# Patient Record
Sex: Male | Born: 1945 | Race: Black or African American | Hispanic: No | Smoking: Current every day smoker
Health system: Southern US, Community
[De-identification: ages and names within clinical notes are randomized; demographics above are authoritative.]

## PROBLEM LIST (undated history)

## (undated) HISTORY — PX: HERNIA REPAIR: SHX51

## (undated) HISTORY — PX: TONSILLECTOMY: SUR1361

## (undated) HISTORY — PX: CATARACT EXTRACTION: SUR2

## (undated) HISTORY — PX: REPLACEMENT TOTAL KNEE: SUR1224

---

## 2014-12-20 DIAGNOSIS — I10 Essential (primary) hypertension: Secondary | ICD-10-CM | POA: Insufficient documentation

## 2014-12-20 HISTORY — DX: Essential (primary) hypertension: I10

## 2015-10-27 DIAGNOSIS — Z79899 Other long term (current) drug therapy: Secondary | ICD-10-CM | POA: Diagnosis not present

## 2015-10-27 DIAGNOSIS — G8929 Other chronic pain: Secondary | ICD-10-CM | POA: Diagnosis not present

## 2015-10-27 DIAGNOSIS — I10 Essential (primary) hypertension: Secondary | ICD-10-CM | POA: Diagnosis not present

## 2015-10-27 DIAGNOSIS — M5442 Lumbago with sciatica, left side: Secondary | ICD-10-CM | POA: Diagnosis not present

## 2015-10-27 DIAGNOSIS — J439 Emphysema, unspecified: Secondary | ICD-10-CM | POA: Diagnosis not present

## 2015-10-27 DIAGNOSIS — R002 Palpitations: Secondary | ICD-10-CM | POA: Diagnosis not present

## 2015-10-30 DIAGNOSIS — M47896 Other spondylosis, lumbar region: Secondary | ICD-10-CM | POA: Diagnosis not present

## 2015-10-30 DIAGNOSIS — M5136 Other intervertebral disc degeneration, lumbar region: Secondary | ICD-10-CM | POA: Diagnosis not present

## 2015-10-30 DIAGNOSIS — J439 Emphysema, unspecified: Secondary | ICD-10-CM | POA: Diagnosis not present

## 2015-10-30 DIAGNOSIS — M5442 Lumbago with sciatica, left side: Secondary | ICD-10-CM | POA: Diagnosis not present

## 2016-04-05 DIAGNOSIS — M21611 Bunion of right foot: Secondary | ICD-10-CM | POA: Diagnosis not present

## 2016-04-05 DIAGNOSIS — I10 Essential (primary) hypertension: Secondary | ICD-10-CM | POA: Diagnosis not present

## 2016-04-05 DIAGNOSIS — M5136 Other intervertebral disc degeneration, lumbar region: Secondary | ICD-10-CM | POA: Diagnosis not present

## 2016-05-13 DIAGNOSIS — B353 Tinea pedis: Secondary | ICD-10-CM | POA: Insufficient documentation

## 2016-05-13 DIAGNOSIS — M2041 Other hammer toe(s) (acquired), right foot: Secondary | ICD-10-CM | POA: Insufficient documentation

## 2016-05-13 DIAGNOSIS — L84 Corns and callosities: Secondary | ICD-10-CM | POA: Insufficient documentation

## 2016-05-13 DIAGNOSIS — M2042 Other hammer toe(s) (acquired), left foot: Secondary | ICD-10-CM | POA: Insufficient documentation

## 2016-05-13 HISTORY — DX: Tinea pedis: B35.3

## 2016-05-13 HISTORY — DX: Corns and callosities: L84

## 2016-05-13 HISTORY — DX: Other hammer toe(s) (acquired), right foot: M20.41

## 2016-09-17 DIAGNOSIS — M545 Low back pain: Secondary | ICD-10-CM | POA: Diagnosis not present

## 2016-09-17 DIAGNOSIS — G8929 Other chronic pain: Secondary | ICD-10-CM | POA: Diagnosis not present

## 2016-09-17 DIAGNOSIS — N400 Enlarged prostate without lower urinary tract symptoms: Secondary | ICD-10-CM | POA: Diagnosis not present

## 2016-09-17 DIAGNOSIS — J449 Chronic obstructive pulmonary disease, unspecified: Secondary | ICD-10-CM | POA: Diagnosis not present

## 2016-09-17 DIAGNOSIS — Z0000 Encounter for general adult medical examination without abnormal findings: Secondary | ICD-10-CM | POA: Diagnosis not present

## 2016-09-17 DIAGNOSIS — Z1389 Encounter for screening for other disorder: Secondary | ICD-10-CM | POA: Diagnosis not present

## 2016-09-17 DIAGNOSIS — Z1212 Encounter for screening for malignant neoplasm of rectum: Secondary | ICD-10-CM | POA: Diagnosis not present

## 2016-09-17 DIAGNOSIS — I10 Essential (primary) hypertension: Secondary | ICD-10-CM | POA: Diagnosis not present

## 2016-10-03 DIAGNOSIS — Z01818 Encounter for other preprocedural examination: Secondary | ICD-10-CM | POA: Diagnosis not present

## 2016-10-15 DIAGNOSIS — Z1211 Encounter for screening for malignant neoplasm of colon: Secondary | ICD-10-CM | POA: Diagnosis not present

## 2016-10-15 DIAGNOSIS — Z79899 Other long term (current) drug therapy: Secondary | ICD-10-CM | POA: Diagnosis not present

## 2016-10-15 DIAGNOSIS — J449 Chronic obstructive pulmonary disease, unspecified: Secondary | ICD-10-CM | POA: Diagnosis not present

## 2016-10-15 DIAGNOSIS — K648 Other hemorrhoids: Secondary | ICD-10-CM | POA: Diagnosis not present

## 2016-10-15 DIAGNOSIS — R011 Cardiac murmur, unspecified: Secondary | ICD-10-CM | POA: Diagnosis not present

## 2016-10-15 DIAGNOSIS — K5730 Diverticulosis of large intestine without perforation or abscess without bleeding: Secondary | ICD-10-CM | POA: Diagnosis not present

## 2016-10-15 DIAGNOSIS — I10 Essential (primary) hypertension: Secondary | ICD-10-CM | POA: Diagnosis not present

## 2016-10-15 DIAGNOSIS — K469 Unspecified abdominal hernia without obstruction or gangrene: Secondary | ICD-10-CM | POA: Diagnosis not present

## 2016-10-15 DIAGNOSIS — Z7982 Long term (current) use of aspirin: Secondary | ICD-10-CM | POA: Diagnosis not present

## 2016-11-20 DIAGNOSIS — H26493 Other secondary cataract, bilateral: Secondary | ICD-10-CM | POA: Diagnosis not present

## 2017-01-13 DIAGNOSIS — H8110 Benign paroxysmal vertigo, unspecified ear: Secondary | ICD-10-CM | POA: Diagnosis not present

## 2017-01-15 DIAGNOSIS — H8110 Benign paroxysmal vertigo, unspecified ear: Secondary | ICD-10-CM | POA: Diagnosis not present

## 2017-01-22 DIAGNOSIS — H8110 Benign paroxysmal vertigo, unspecified ear: Secondary | ICD-10-CM | POA: Diagnosis not present

## 2017-05-19 DIAGNOSIS — D179 Benign lipomatous neoplasm, unspecified: Secondary | ICD-10-CM | POA: Diagnosis not present

## 2017-09-18 DIAGNOSIS — J449 Chronic obstructive pulmonary disease, unspecified: Secondary | ICD-10-CM | POA: Diagnosis not present

## 2017-09-18 DIAGNOSIS — I10 Essential (primary) hypertension: Secondary | ICD-10-CM | POA: Diagnosis not present

## 2017-09-18 DIAGNOSIS — Z1389 Encounter for screening for other disorder: Secondary | ICD-10-CM | POA: Diagnosis not present

## 2017-09-18 DIAGNOSIS — M5136 Other intervertebral disc degeneration, lumbar region: Secondary | ICD-10-CM | POA: Diagnosis not present

## 2017-09-18 DIAGNOSIS — Z0000 Encounter for general adult medical examination without abnormal findings: Secondary | ICD-10-CM | POA: Diagnosis not present

## 2017-09-18 DIAGNOSIS — K219 Gastro-esophageal reflux disease without esophagitis: Secondary | ICD-10-CM | POA: Diagnosis not present

## 2017-09-18 DIAGNOSIS — Z125 Encounter for screening for malignant neoplasm of prostate: Secondary | ICD-10-CM | POA: Diagnosis not present

## 2017-10-29 DIAGNOSIS — M5440 Lumbago with sciatica, unspecified side: Secondary | ICD-10-CM | POA: Diagnosis not present

## 2017-10-29 DIAGNOSIS — G894 Chronic pain syndrome: Secondary | ICD-10-CM | POA: Diagnosis not present

## 2017-10-29 DIAGNOSIS — Z79899 Other long term (current) drug therapy: Secondary | ICD-10-CM | POA: Diagnosis not present

## 2017-10-29 DIAGNOSIS — Z9114 Patient's other noncompliance with medication regimen: Secondary | ICD-10-CM | POA: Diagnosis not present

## 2017-10-29 DIAGNOSIS — G8921 Chronic pain due to trauma: Secondary | ICD-10-CM | POA: Diagnosis not present

## 2017-11-04 DIAGNOSIS — M545 Low back pain: Secondary | ICD-10-CM | POA: Diagnosis not present

## 2017-11-04 DIAGNOSIS — M25552 Pain in left hip: Secondary | ICD-10-CM | POA: Diagnosis not present

## 2017-11-04 DIAGNOSIS — M48061 Spinal stenosis, lumbar region without neurogenic claudication: Secondary | ICD-10-CM | POA: Diagnosis not present

## 2017-11-04 DIAGNOSIS — M5126 Other intervertebral disc displacement, lumbar region: Secondary | ICD-10-CM | POA: Diagnosis not present

## 2017-11-04 DIAGNOSIS — M25551 Pain in right hip: Secondary | ICD-10-CM | POA: Diagnosis not present

## 2017-11-19 DIAGNOSIS — G8921 Chronic pain due to trauma: Secondary | ICD-10-CM | POA: Diagnosis not present

## 2017-11-19 DIAGNOSIS — Z79899 Other long term (current) drug therapy: Secondary | ICD-10-CM | POA: Diagnosis not present

## 2017-11-19 DIAGNOSIS — Z9114 Patient's other noncompliance with medication regimen: Secondary | ICD-10-CM | POA: Diagnosis not present

## 2017-11-19 DIAGNOSIS — G894 Chronic pain syndrome: Secondary | ICD-10-CM | POA: Diagnosis not present

## 2017-11-19 DIAGNOSIS — M5440 Lumbago with sciatica, unspecified side: Secondary | ICD-10-CM | POA: Diagnosis not present

## 2017-12-17 DIAGNOSIS — Z79899 Other long term (current) drug therapy: Secondary | ICD-10-CM | POA: Diagnosis not present

## 2017-12-17 DIAGNOSIS — M5440 Lumbago with sciatica, unspecified side: Secondary | ICD-10-CM | POA: Diagnosis not present

## 2017-12-17 DIAGNOSIS — G894 Chronic pain syndrome: Secondary | ICD-10-CM | POA: Diagnosis not present

## 2017-12-17 DIAGNOSIS — Z9114 Patient's other noncompliance with medication regimen: Secondary | ICD-10-CM | POA: Diagnosis not present

## 2017-12-17 DIAGNOSIS — G8921 Chronic pain due to trauma: Secondary | ICD-10-CM | POA: Diagnosis not present

## 2018-01-19 DIAGNOSIS — I10 Essential (primary) hypertension: Secondary | ICD-10-CM | POA: Diagnosis not present

## 2018-01-19 DIAGNOSIS — M545 Low back pain: Secondary | ICD-10-CM | POA: Diagnosis not present

## 2018-04-20 DIAGNOSIS — M545 Low back pain: Secondary | ICD-10-CM | POA: Diagnosis not present

## 2018-04-25 DIAGNOSIS — B029 Zoster without complications: Secondary | ICD-10-CM | POA: Diagnosis not present

## 2018-04-25 DIAGNOSIS — B0223 Postherpetic polyneuropathy: Secondary | ICD-10-CM | POA: Diagnosis not present

## 2018-05-27 DIAGNOSIS — I10 Essential (primary) hypertension: Secondary | ICD-10-CM | POA: Diagnosis not present

## 2018-09-16 DIAGNOSIS — I10 Essential (primary) hypertension: Secondary | ICD-10-CM | POA: Diagnosis not present

## 2018-09-16 DIAGNOSIS — T148XXA Other injury of unspecified body region, initial encounter: Secondary | ICD-10-CM | POA: Diagnosis not present

## 2018-10-30 DIAGNOSIS — I10 Essential (primary) hypertension: Secondary | ICD-10-CM | POA: Diagnosis not present

## 2018-12-02 DIAGNOSIS — Z1389 Encounter for screening for other disorder: Secondary | ICD-10-CM | POA: Diagnosis not present

## 2018-12-02 DIAGNOSIS — R3911 Hesitancy of micturition: Secondary | ICD-10-CM | POA: Diagnosis not present

## 2018-12-02 DIAGNOSIS — I10 Essential (primary) hypertension: Secondary | ICD-10-CM | POA: Diagnosis not present

## 2018-12-02 DIAGNOSIS — J449 Chronic obstructive pulmonary disease, unspecified: Secondary | ICD-10-CM | POA: Diagnosis not present

## 2018-12-02 DIAGNOSIS — Z0000 Encounter for general adult medical examination without abnormal findings: Secondary | ICD-10-CM | POA: Diagnosis not present

## 2018-12-02 DIAGNOSIS — M545 Low back pain: Secondary | ICD-10-CM | POA: Diagnosis not present

## 2018-12-02 DIAGNOSIS — Z87891 Personal history of nicotine dependence: Secondary | ICD-10-CM | POA: Diagnosis not present

## 2018-12-17 DIAGNOSIS — N318 Other neuromuscular dysfunction of bladder: Secondary | ICD-10-CM | POA: Diagnosis not present

## 2018-12-17 DIAGNOSIS — N3000 Acute cystitis without hematuria: Secondary | ICD-10-CM | POA: Diagnosis not present

## 2019-03-04 DIAGNOSIS — I10 Essential (primary) hypertension: Secondary | ICD-10-CM | POA: Diagnosis not present

## 2019-04-06 DIAGNOSIS — N3020 Other chronic cystitis without hematuria: Secondary | ICD-10-CM | POA: Diagnosis not present

## 2019-06-03 DIAGNOSIS — M19042 Primary osteoarthritis, left hand: Secondary | ICD-10-CM | POA: Diagnosis not present

## 2019-06-03 DIAGNOSIS — I10 Essential (primary) hypertension: Secondary | ICD-10-CM | POA: Diagnosis not present

## 2019-06-03 DIAGNOSIS — M79642 Pain in left hand: Secondary | ICD-10-CM | POA: Diagnosis not present

## 2019-06-03 DIAGNOSIS — R262 Difficulty in walking, not elsewhere classified: Secondary | ICD-10-CM | POA: Diagnosis not present

## 2019-06-09 DIAGNOSIS — M545 Low back pain: Secondary | ICD-10-CM | POA: Diagnosis not present

## 2019-06-09 DIAGNOSIS — W19XXXD Unspecified fall, subsequent encounter: Secondary | ICD-10-CM | POA: Diagnosis not present

## 2019-06-09 DIAGNOSIS — M542 Cervicalgia: Secondary | ICD-10-CM | POA: Diagnosis not present

## 2019-06-09 DIAGNOSIS — S199XXA Unspecified injury of neck, initial encounter: Secondary | ICD-10-CM | POA: Diagnosis not present

## 2019-06-15 DIAGNOSIS — M542 Cervicalgia: Secondary | ICD-10-CM | POA: Diagnosis not present

## 2019-06-15 DIAGNOSIS — M6281 Muscle weakness (generalized): Secondary | ICD-10-CM | POA: Diagnosis not present

## 2019-06-15 DIAGNOSIS — R2681 Unsteadiness on feet: Secondary | ICD-10-CM | POA: Diagnosis not present

## 2019-06-15 DIAGNOSIS — R2689 Other abnormalities of gait and mobility: Secondary | ICD-10-CM | POA: Diagnosis not present

## 2019-06-15 DIAGNOSIS — M2560 Stiffness of unspecified joint, not elsewhere classified: Secondary | ICD-10-CM | POA: Diagnosis not present

## 2019-06-15 DIAGNOSIS — R293 Abnormal posture: Secondary | ICD-10-CM | POA: Diagnosis not present

## 2019-06-17 DIAGNOSIS — M542 Cervicalgia: Secondary | ICD-10-CM | POA: Diagnosis not present

## 2019-06-17 DIAGNOSIS — M2560 Stiffness of unspecified joint, not elsewhere classified: Secondary | ICD-10-CM | POA: Diagnosis not present

## 2019-06-17 DIAGNOSIS — M6281 Muscle weakness (generalized): Secondary | ICD-10-CM | POA: Diagnosis not present

## 2019-06-17 DIAGNOSIS — R293 Abnormal posture: Secondary | ICD-10-CM | POA: Diagnosis not present

## 2019-06-17 DIAGNOSIS — R2689 Other abnormalities of gait and mobility: Secondary | ICD-10-CM | POA: Diagnosis not present

## 2019-06-17 DIAGNOSIS — R2681 Unsteadiness on feet: Secondary | ICD-10-CM | POA: Diagnosis not present

## 2019-06-22 DIAGNOSIS — M6281 Muscle weakness (generalized): Secondary | ICD-10-CM | POA: Diagnosis not present

## 2019-06-22 DIAGNOSIS — R2681 Unsteadiness on feet: Secondary | ICD-10-CM | POA: Diagnosis not present

## 2019-06-22 DIAGNOSIS — R293 Abnormal posture: Secondary | ICD-10-CM | POA: Diagnosis not present

## 2019-06-22 DIAGNOSIS — R2689 Other abnormalities of gait and mobility: Secondary | ICD-10-CM | POA: Diagnosis not present

## 2019-06-22 DIAGNOSIS — M2560 Stiffness of unspecified joint, not elsewhere classified: Secondary | ICD-10-CM | POA: Diagnosis not present

## 2019-06-22 DIAGNOSIS — M542 Cervicalgia: Secondary | ICD-10-CM | POA: Diagnosis not present

## 2019-07-02 DIAGNOSIS — M542 Cervicalgia: Secondary | ICD-10-CM | POA: Diagnosis not present

## 2019-07-02 DIAGNOSIS — M2560 Stiffness of unspecified joint, not elsewhere classified: Secondary | ICD-10-CM | POA: Diagnosis not present

## 2019-07-02 DIAGNOSIS — M6281 Muscle weakness (generalized): Secondary | ICD-10-CM | POA: Diagnosis not present

## 2019-07-02 DIAGNOSIS — R2681 Unsteadiness on feet: Secondary | ICD-10-CM | POA: Diagnosis not present

## 2019-07-02 DIAGNOSIS — R2689 Other abnormalities of gait and mobility: Secondary | ICD-10-CM | POA: Diagnosis not present

## 2019-07-02 DIAGNOSIS — R293 Abnormal posture: Secondary | ICD-10-CM | POA: Diagnosis not present

## 2019-07-08 DIAGNOSIS — R2689 Other abnormalities of gait and mobility: Secondary | ICD-10-CM | POA: Diagnosis not present

## 2019-07-08 DIAGNOSIS — R2681 Unsteadiness on feet: Secondary | ICD-10-CM | POA: Diagnosis not present

## 2019-07-08 DIAGNOSIS — M542 Cervicalgia: Secondary | ICD-10-CM | POA: Diagnosis not present

## 2019-07-08 DIAGNOSIS — M6281 Muscle weakness (generalized): Secondary | ICD-10-CM | POA: Diagnosis not present

## 2019-07-08 DIAGNOSIS — M2560 Stiffness of unspecified joint, not elsewhere classified: Secondary | ICD-10-CM | POA: Diagnosis not present

## 2019-07-08 DIAGNOSIS — R293 Abnormal posture: Secondary | ICD-10-CM | POA: Diagnosis not present

## 2019-08-13 DIAGNOSIS — R42 Dizziness and giddiness: Secondary | ICD-10-CM | POA: Diagnosis not present

## 2019-08-26 DIAGNOSIS — R293 Abnormal posture: Secondary | ICD-10-CM | POA: Diagnosis not present

## 2019-08-26 DIAGNOSIS — H8111 Benign paroxysmal vertigo, right ear: Secondary | ICD-10-CM | POA: Diagnosis not present

## 2019-08-26 DIAGNOSIS — M2560 Stiffness of unspecified joint, not elsewhere classified: Secondary | ICD-10-CM | POA: Diagnosis not present

## 2019-08-26 DIAGNOSIS — H8190 Unspecified disorder of vestibular function, unspecified ear: Secondary | ICD-10-CM | POA: Diagnosis not present

## 2019-08-26 DIAGNOSIS — M6281 Muscle weakness (generalized): Secondary | ICD-10-CM | POA: Diagnosis not present

## 2019-08-26 DIAGNOSIS — M542 Cervicalgia: Secondary | ICD-10-CM | POA: Diagnosis not present

## 2019-08-26 DIAGNOSIS — R2681 Unsteadiness on feet: Secondary | ICD-10-CM | POA: Diagnosis not present

## 2019-08-26 DIAGNOSIS — R2689 Other abnormalities of gait and mobility: Secondary | ICD-10-CM | POA: Diagnosis not present

## 2019-08-30 DIAGNOSIS — M542 Cervicalgia: Secondary | ICD-10-CM | POA: Diagnosis not present

## 2019-08-30 DIAGNOSIS — R2689 Other abnormalities of gait and mobility: Secondary | ICD-10-CM | POA: Diagnosis not present

## 2019-08-30 DIAGNOSIS — H8111 Benign paroxysmal vertigo, right ear: Secondary | ICD-10-CM | POA: Diagnosis not present

## 2019-08-30 DIAGNOSIS — M2560 Stiffness of unspecified joint, not elsewhere classified: Secondary | ICD-10-CM | POA: Diagnosis not present

## 2019-08-30 DIAGNOSIS — M6281 Muscle weakness (generalized): Secondary | ICD-10-CM | POA: Diagnosis not present

## 2019-08-30 DIAGNOSIS — R293 Abnormal posture: Secondary | ICD-10-CM | POA: Diagnosis not present

## 2019-08-30 DIAGNOSIS — R2681 Unsteadiness on feet: Secondary | ICD-10-CM | POA: Diagnosis not present

## 2019-08-30 DIAGNOSIS — H8190 Unspecified disorder of vestibular function, unspecified ear: Secondary | ICD-10-CM | POA: Diagnosis not present

## 2019-09-07 DIAGNOSIS — R293 Abnormal posture: Secondary | ICD-10-CM | POA: Diagnosis not present

## 2019-09-07 DIAGNOSIS — R2689 Other abnormalities of gait and mobility: Secondary | ICD-10-CM | POA: Diagnosis not present

## 2019-09-07 DIAGNOSIS — H8111 Benign paroxysmal vertigo, right ear: Secondary | ICD-10-CM | POA: Diagnosis not present

## 2019-09-07 DIAGNOSIS — M6281 Muscle weakness (generalized): Secondary | ICD-10-CM | POA: Diagnosis not present

## 2019-09-07 DIAGNOSIS — H8190 Unspecified disorder of vestibular function, unspecified ear: Secondary | ICD-10-CM | POA: Diagnosis not present

## 2019-09-07 DIAGNOSIS — R2681 Unsteadiness on feet: Secondary | ICD-10-CM | POA: Diagnosis not present

## 2019-09-07 DIAGNOSIS — M2560 Stiffness of unspecified joint, not elsewhere classified: Secondary | ICD-10-CM | POA: Diagnosis not present

## 2019-09-07 DIAGNOSIS — M542 Cervicalgia: Secondary | ICD-10-CM | POA: Diagnosis not present

## 2019-09-21 DIAGNOSIS — R079 Chest pain, unspecified: Secondary | ICD-10-CM | POA: Diagnosis not present

## 2019-09-21 DIAGNOSIS — D179 Benign lipomatous neoplasm, unspecified: Secondary | ICD-10-CM | POA: Diagnosis not present

## 2019-09-21 DIAGNOSIS — I10 Essential (primary) hypertension: Secondary | ICD-10-CM | POA: Diagnosis not present

## 2019-09-22 DIAGNOSIS — R079 Chest pain, unspecified: Secondary | ICD-10-CM | POA: Diagnosis not present

## 2019-09-23 DIAGNOSIS — R079 Chest pain, unspecified: Secondary | ICD-10-CM | POA: Diagnosis not present

## 2019-10-05 DIAGNOSIS — R0609 Other forms of dyspnea: Secondary | ICD-10-CM | POA: Diagnosis not present

## 2019-10-05 DIAGNOSIS — I4891 Unspecified atrial fibrillation: Secondary | ICD-10-CM | POA: Diagnosis not present

## 2019-10-05 DIAGNOSIS — D213 Benign neoplasm of connective and other soft tissue of thorax: Secondary | ICD-10-CM | POA: Diagnosis not present

## 2019-10-05 DIAGNOSIS — U071 COVID-19: Secondary | ICD-10-CM | POA: Diagnosis not present

## 2019-10-05 DIAGNOSIS — J449 Chronic obstructive pulmonary disease, unspecified: Secondary | ICD-10-CM

## 2019-10-05 DIAGNOSIS — R079 Chest pain, unspecified: Secondary | ICD-10-CM

## 2019-10-05 DIAGNOSIS — R9439 Abnormal result of other cardiovascular function study: Secondary | ICD-10-CM

## 2019-10-05 DIAGNOSIS — E785 Hyperlipidemia, unspecified: Secondary | ICD-10-CM

## 2019-10-05 DIAGNOSIS — R001 Bradycardia, unspecified: Secondary | ICD-10-CM | POA: Diagnosis not present

## 2019-10-05 DIAGNOSIS — I10 Essential (primary) hypertension: Secondary | ICD-10-CM

## 2019-10-05 DIAGNOSIS — I340 Nonrheumatic mitral (valve) insufficiency: Secondary | ICD-10-CM

## 2019-10-05 DIAGNOSIS — R0789 Other chest pain: Secondary | ICD-10-CM

## 2019-10-06 DIAGNOSIS — Z87891 Personal history of nicotine dependence: Secondary | ICD-10-CM | POA: Diagnosis not present

## 2019-10-06 DIAGNOSIS — R079 Chest pain, unspecified: Secondary | ICD-10-CM | POA: Diagnosis not present

## 2019-10-06 DIAGNOSIS — J449 Chronic obstructive pulmonary disease, unspecified: Secondary | ICD-10-CM | POA: Diagnosis not present

## 2019-10-06 DIAGNOSIS — R0609 Other forms of dyspnea: Secondary | ICD-10-CM | POA: Diagnosis not present

## 2019-10-06 DIAGNOSIS — U071 COVID-19: Secondary | ICD-10-CM | POA: Diagnosis not present

## 2019-10-06 DIAGNOSIS — Z79899 Other long term (current) drug therapy: Secondary | ICD-10-CM | POA: Diagnosis not present

## 2019-10-06 DIAGNOSIS — I452 Bifascicular block: Secondary | ICD-10-CM | POA: Diagnosis not present

## 2019-10-06 DIAGNOSIS — R001 Bradycardia, unspecified: Secondary | ICD-10-CM | POA: Diagnosis not present

## 2019-10-06 DIAGNOSIS — K219 Gastro-esophageal reflux disease without esophagitis: Secondary | ICD-10-CM | POA: Diagnosis not present

## 2019-10-06 DIAGNOSIS — E785 Hyperlipidemia, unspecified: Secondary | ICD-10-CM | POA: Diagnosis not present

## 2019-10-06 DIAGNOSIS — I10 Essential (primary) hypertension: Secondary | ICD-10-CM | POA: Diagnosis not present

## 2019-10-06 HISTORY — DX: COVID-19: U07.1

## 2019-10-20 ENCOUNTER — Other Ambulatory Visit: Payer: Self-pay

## 2019-10-20 ENCOUNTER — Encounter: Payer: Self-pay | Admitting: *Deleted

## 2019-10-20 ENCOUNTER — Ambulatory Visit (INDEPENDENT_AMBULATORY_CARE_PROVIDER_SITE_OTHER): Payer: Medicare Other | Admitting: Cardiology

## 2019-10-20 VITALS — BP 130/68 | HR 59 | Ht 71.0 in | Wt 203.0 lb

## 2019-10-20 DIAGNOSIS — E785 Hyperlipidemia, unspecified: Secondary | ICD-10-CM | POA: Insufficient documentation

## 2019-10-20 DIAGNOSIS — E782 Mixed hyperlipidemia: Secondary | ICD-10-CM

## 2019-10-20 DIAGNOSIS — R9439 Abnormal result of other cardiovascular function study: Secondary | ICD-10-CM

## 2019-10-20 DIAGNOSIS — R079 Chest pain, unspecified: Secondary | ICD-10-CM | POA: Diagnosis not present

## 2019-10-20 DIAGNOSIS — I10 Essential (primary) hypertension: Secondary | ICD-10-CM | POA: Diagnosis not present

## 2019-10-20 DIAGNOSIS — R0600 Dyspnea, unspecified: Secondary | ICD-10-CM

## 2019-10-20 DIAGNOSIS — Z01812 Encounter for preprocedural laboratory examination: Secondary | ICD-10-CM | POA: Insufficient documentation

## 2019-10-20 DIAGNOSIS — R0609 Other forms of dyspnea: Secondary | ICD-10-CM

## 2019-10-20 DIAGNOSIS — U071 COVID-19: Secondary | ICD-10-CM | POA: Insufficient documentation

## 2019-10-20 HISTORY — DX: Chest pain, unspecified: R07.9

## 2019-10-20 HISTORY — DX: Dyspnea, unspecified: R06.00

## 2019-10-20 HISTORY — DX: Hyperlipidemia, unspecified: E78.5

## 2019-10-20 HISTORY — DX: Other forms of dyspnea: R06.09

## 2019-10-20 MED ORDER — NITROGLYCERIN 0.4 MG SL SUBL: 0.4000 mg | SUBLINGUAL_TABLET | SUBLINGUAL | 3 refills | Status: DC | PRN

## 2019-10-20 NOTE — Patient Instructions (Signed)
Medication Instructions:  Your physician has recommended you make the following change in your medication:  Nitroglycerin 0.4 mg sublingual (under your tongue) as needed for chest pain. If experiencing chest pain, stop what you are doing and sit down. Take 1 nitroglycerin and wait 5 minutes. If chest pain continues, take another nitroglycerin and wait 5 minutes. If chest pain does not subside, take 1 more nitroglycerin and dial 911. You make take a total of 3 nitroglycerin in a 15 minute time frame. t.  *If you need a refill on your cardiac medications before your next appointment, please call your pharmacy*  Lab Work: Your physician recommends that you return for lab work in: [place]  If you have labs (blood work) drawn today and your tests are completely normal, you will receive your results only by: Marland Kitchen MyChart Message (if you have MyChart) OR . A paper copy in the mail If you have any lab test that is abnormal or we need to change your treatment, we will call you to review the results.  Testing/Procedures:    [place] [address] Dept: [phone] Loc: [phone]  Luis Hood  10/20/2019  You are scheduled for a Cardiac Catheterization on Thursday, January 21 with Dr. [name].  1. Please arrive at the [facility] (Main Entrance A) at [facility]: [address] [place],  [address] at 10:30 AM (This time is two hours before your procedure to ensure your preparation). Free valet parking service is available.   Special note: Every effort is made to have your procedure done on time. Please understand that emergencies sometimes delay scheduled procedures.  2. Diet: Do not eat solid foods after midnight.  The patient may have clear liquids until 5am upon the day of the procedure.  3. Labs: None needed  4. Medication instructions in preparation  for your procedure:   Contrast Allergy: No  On the morning of your procedure, take your Aspirin and any morning medicines NOT listed above.  You may use sips of water.  5. Plan for one night stay--bring personal belongings. 6. Bring a current list of your medications and current insurance cards. 7. You MUST have a responsible person to drive you home. 8. Someone MUST be with you the first 24 hours after you arrive home or your discharge will be delayed. 9. Please wear clothes that are easy to get on and off and wear slip-on shoes.  Thank you for allowing [place] to care for you!   -- [facility]  Follow-Up: At [facility], you and your health needs are our priority.  As part of our continuing mission to provide you with exceptional heart care, we have created designated Provider Care Teams.  These Care Teams include your primary Cardiologist (physician) and Advanced Practice Providers (APPs -  Physician Assistants and Nurse Practitioners) who all work together to provide you with the care you need, when you need it.  Your next appointment:   1 month(s)  The format for your next appointment:   In Person  Provider:   Berniece Salines, DO  Other Instructions

## 2019-10-20 NOTE — H&P (View-Only) (Signed)
Cardiology Office Note:    Date:  10/20/2019   ID:  Luis Hood, DOB [birth year], MRN [id number]  PCP:  Townsend Roger, MD  Cardiologist:  Berniece Salines, DO  Electrophysiologist:  None   Referring MD: Nona Dell, Corene Cornea, MD   The patient was referred for abnormal stress test and   History of Present Illness:    Luis Hood is a 74 y.o. male with a hx of hypertension, hyperlipidemia, former smoker family history of coronary disease presented to be evaluated for chest pain as well as abnormal stress test.  Patient tells me that even prior to his Covid infection he has been experiencing left-sided dull intermittent chest pain which sometimes radiates to his neck.  He did speak with his primary care physician at which time he was recommended to undergo a pharmacologic nuclear stress test on September 16, 2019.  Stress test showed fixed defect of moderate severity at the inferolateral wall however was noted that the patient has significant frequent PVCs ejection fraction 43%. He was then admitted on December 30 for COVID-19 infection. he has been treated.    He tells me that since he recovered he now again feels the same chest pain he used to feel prior to his hospital admission.  He admits associated shortness of breath even before his Covid infection.  Past Medical History:  Diagnosis Date  . Chest pain 10/20/2019  . Corn of toe 05/13/2016  . COVID-19 10/20/2019  . Dyslipidemia 10/20/2019  . Dyspnea on exertion 10/20/2019  . Essential hypertension 12/20/2014  . 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 Tinea pedis of both feet 05/13/2016    Past Surgical History:  Procedure Laterality Date  . CATARACT EXTRACTION Bilateral   . HERNIA REPAIR    . REPLACEMENT TOTAL KNEE Bilateral   . TONSILLECTOMY      Current Medications: Current Meds  Medication Sig  . albuterol (VENTOLIN HFA) 108 (90 Base) MCG/ACT inhaler Inhale into the lungs.  Marland Kitchen amLODipine (NORVASC) 10 MG tablet Take 10 mg by mouth.  Marland Kitchen  aspirin 81 MG EC tablet Take 81 mg by mouth.  . fluticasone (FLOVENT HFA) 110 MCG/ACT inhaler Inject 2 puffs into the muscle.  . lisinopril (ZESTRIL) 5 MG tablet Take 5 mg by mouth daily.  . meclizine (ANTIVERT) 25 MG tablet Take 12.5 mg by mouth.  . omega-3 acid ethyl esters (LOVAZA) 1 g capsule Take by mouth 2 (two) times daily.  . Omeprazole 20 MG TBEC 20 mg daily.  . rosuvastatin (CRESTOR) 10 MG tablet Take 20 mg by mouth.     Allergies:   Patient has no known allergies.   Social History   Socioeconomic History  . Marital status: Divorced    Spouse name: Not on file  . Number of children: Not on file  . Years of education: Not on file  . Highest education level: Not on file  Occupational History  . Not on file  Tobacco Use  . Smoking status: Former Smoker    Types: Cigarettes    Quit date: 2020    Years since quitting: 1.0  . Smokeless tobacco: Never Used  Substance and Sexual Activity  . Alcohol use: Yes    Alcohol/week: 2.0 standard drinks    Types: 2 Shots of liquor per week  . Drug use: Never  . Sexual activity: Not on file  Other Topics Concern  . Not on file  Social History Narrative  . Not on file   Social Determinants  of Health   Financial Resource Strain:   . Difficulty of Paying Living Expenses: Not on file  Food Insecurity:   . Worried About Charity fundraiser in the Last Year: Not on file  . Ran Out of Food in the Last Year: Not on file  Transportation Needs:   . Lack of Transportation (Medical): Not on file  . Lack of Transportation (Non-Medical): Not on file  Physical Activity:   . Days of Exercise per Week: Not on file  . Minutes of Exercise per Session: Not on file  Stress:   . Feeling of Stress : Not on file  Social Connections:   . Frequency of Communication with Friends and Family: Not on file  . Frequency of Social Gatherings with Friends and Family: Not on file  . Attends Religious Services: Not on file  . Active Member of Clubs or  Organizations: Not on file  . Attends Archivist Meetings: Not on file  . Marital Status: Not on file     Family History: The patient's family history includes Seizures in his mother.  ROS:   Review of Systems  Constitution: Negative for decreased appetite, fever and weight gain.  HENT: Negative for congestion, ear discharge, hoarse voice and sore throat.   Eyes: Negative for discharge, redness, vision loss in right eye and visual halos.  Cardiovascular: Reports chest pain and shortness of breath.  Negative forleg swelling, orthopnea and palpitations.  Respiratory: Negative for cough, hemoptysis, shortness of breath and snoring.   Endocrine: Negative for heat intolerance and polyphagia.  Hematologic/Lymphatic: Negative for bleeding problem. Does not bruise/bleed easily.  Skin: Negative for flushing, nail changes, rash and suspicious lesions.  Musculoskeletal: Negative for arthritis, joint pain, muscle cramps, myalgias, neck pain and stiffness.  Gastrointestinal: Negative for abdominal pain, bowel incontinence, diarrhea and excessive appetite.  Genitourinary: Negative for decreased libido, genital sores and incomplete emptying.  Neurological: Negative for brief paralysis, focal weakness, headaches and loss of balance.  Psychiatric/Behavioral: Negative for altered mental status, depression and suicidal ideas.  Allergic/Immunologic: Negative for HIV exposure and persistent infections.    EKGs/Labs/Other Studies Reviewed:    The following studies were reviewed today:   EKG:  The ekg ordered today demonstrates sinus bradycardia, with premature atrial complexes, right bundle branch block and left anterior fascicular block.  Pharmacologic nuclear stress test with fixed defect of moderate severity at the inferolateral wall at the apex.  Ejection fraction 43%.  Of note significant fusion PVC during the stress portion of the test.  Transthoracic echocardiogram 10/06/2019 Low  normal left ventricular ejection fraction 50 to 55%.  Paradoxical motion consistent with left bundle branch block.  Left atrium normal size, right atrium normal size.  Mild mitral regurgitation.  Trace tricuspid regurgitation.  Aortic root and aortic arch well visualized.  Recent Labs: No results found for requested labs within last 8760 hours.  Recent Lipid Panel No results found for: CHOL, TRIG, HDL, CHOLHDL, VLDL, LDLCALC, LDLDIRECT  Physical Exam:    VS:  BP 130/68 (BP Location: Left Arm, Patient Position: Sitting, Cuff Size: Normal)   Pulse (!) 59   Ht 5\' 11"  (1.803 m)   Wt 203 lb (92.1 kg)   SpO2 94%   BMI 28.31 kg/m     Wt Readings from Last 3 Encounters:  10/20/19 203 lb (92.1 kg)     GEN: Well nourished, well developed in no acute distress HEENT: Normal NECK: No JVD; No carotid bruits LYMPHATICS: No lymphadenopathy CARDIAC: S1S2  noted,RRR, no murmurs, rubs, gallops RESPIRATORY:  Clear to auscultation without rales, wheezing or rhonchi  ABDOMEN: Soft, non-tender, non-distended, +bowel sounds, no guarding. EXTREMITIES: No edema, No cyanosis, no clubbing MUSCULOSKELETAL:  No edema; No deformity  SKIN: Warm and dry NEUROLOGIC:  Alert and oriented x 3, non-focal PSYCHIATRIC:  Normal affect, good insight  ASSESSMENT:    1. Chest pain of uncertain etiology   2. Abnormal stress test   3. Essential hypertension   4. Pre-procedure lab exam   5. Mixed hyperlipidemia    PLAN:     His recent stress test was reported as abnormal, and the patient is also expressing that he has chest pain on a nightly basis.  Therefore at this time I would recommend the patient pursue left heart catheterization.  The patient understands that risks include but are not limited to stroke (1 in 1000), death (1 in 33), kidney failure [usually temporary] (1 in 500), bleeding (1 in 200), allergic reaction [possibly serious] (1 in 200), and agrees to proceed.  Sublingual nitroglycerin prescription  was sent, its protocol and 911 protocol explained and the patient vocalized understanding questions were answered to the patient's satisfaction.  Continue patient on aspirin 81 mg daily and Crestor 10 g daily.  His blood pressure is appropriate in the office today continue patient on his lisinopril  as well as his amlodipine 10 mg daily.  Lab work will be done today.  The patient is in agreement with the above plan. The patient left the office in stable condition.  The patient will follow up in 1 month.   Medication Adjustments/Labs and Tests Ordered: Current medicines are reviewed at length with the patient today.  Concerns regarding medicines are outlined above.  Orders Placed This Encounter  Procedures  . CBC  . Basic Metabolic Panel (BMET)  . EKG 12-Lead   Meds ordered this encounter  Medications  . nitroGLYCERIN (NITROSTAT) 0.4 MG SL tablet    Sig: Place 1 tablet (0.4 mg total) under the tongue every 5 (five) minutes as needed.    Dispense:  30 tablet    Refill:  3    Patient Instructions  Medication Instructions:  Your physician has recommended you make the following change in your medication:  Nitroglycerin 0.4 mg sublingual (under your tongue) as needed for chest pain. If experiencing chest pain, stop what you are doing and sit down. Take 1 nitroglycerin and wait 5 minutes. If chest pain continues, take another nitroglycerin and wait 5 minutes. If chest pain does not subside, take 1 more nitroglycerin and dial 911. You make take a total of 3 nitroglycerin in a 15 minute time frame. t.  *If you need a refill on your cardiac medications before your next appointment, please call your pharmacy*  Lab Work: Your physician recommends that you return for lab work in: [place]  If you have labs (blood work) drawn today and your tests are completely normal, you will receive your results only by: Marland Kitchen MyChart Message (if you have MyChart) OR . A paper copy in the mail If you  have any lab test that is abnormal or we need to change your treatment, we will call you to review the results.  Testing/Procedures:    [place] [address] Dept: [phone] Loc: [phone]  Zakhary Elena  10/20/2019  You are scheduled for a Cardiac Catheterization on Thursday, January 21 with Dr. [name].  1. Please arrive at the [facility] (Main Entrance A) at [facility]: [address] [place], [place] [address] at 10:30 AM (This time is two hours before your procedure to ensure your preparation). Free valet parking service is available.   Special note: Every effort is made to have your procedure done on time. Please understand that emergencies sometimes delay scheduled procedures.  2. Diet: Do not eat solid foods after midnight.  The patient may have clear liquids until 5am upon the day of the procedure.  3. Labs: None needed  4. Medication instructions in preparation for your procedure:   Contrast Allergy: No  On the morning of your procedure, take your Aspirin and any morning medicines NOT listed above.  You may use sips of water.  5. Plan for one night stay--bring personal belongings. 6. Bring a current list of your medications and current insurance cards. 7. You MUST have a responsible person to drive you home. 8. Someone MUST be with you the first 24 hours after you arrive home or your discharge will be delayed. 9. Please wear clothes that are easy to get on and off and wear slip-on shoes.  Thank you for allowing [place] to care for you!   -- [facility]  Follow-Up: At [facility], you and your health needs are our priority.  As part of our continuing mission to provide you with exceptional heart care, we have created designated Provider Care Teams.  These Care Teams include your primary Cardiologist (physician)  and Advanced Practice Providers (APPs -  Physician Assistants and Nurse Practitioners) who all work together to provide you with the care you need, when you need it.  Your next appointment:   1 month(s)  The format for your next appointment:   In Person  Provider:   Berniece Salines, DO  Other Instructions      Adopting a Healthy Lifestyle.  Know what a healthy weight is for you (roughly BMI <25) and aim to maintain this   Aim for 7+ servings of fruits and vegetables daily   65-80+ fluid ounces of water or unsweet tea for healthy kidneys   Limit to max 1 drink of alcohol per day; avoid smoking/tobacco   Limit animal fats in diet for cholesterol and heart health - choose grass fed whenever available   Avoid highly processed foods, and foods high in saturated/trans fats   Aim for low stress - take time to unwind and care for your mental health   Aim for 150 min of moderate intensity exercise weekly for heart health, and weights twice weekly for bone health   Aim for 7-9 hours of sleep daily   When it comes to diets, agreement about the perfect plan isnt easy to find, even among the experts. Experts at the [place] [employer] developed an idea known as the Healthy Eating Plate. Just imagine a plate divided into logical, healthy portions.   The emphasis is on diet quality:   Load up on vegetables and fruits - one-half of your plate: Aim for color and variety, and remember that potatoes dont count.   Go for whole grains - one-quarter of your plate: Whole wheat, barley, wheat berries, quinoa, oats, brown rice, and foods made with them. If you want pasta, go with whole wheat pasta.   Protein power - one-quarter of your plate: Fish, chicken, beans, and nuts are all healthy, versatile protein sources. Limit red meat.   The diet, however,  does go beyond the plate, offering a few other suggestions.   Use healthy plant oils, such as olive, canola, soy, corn, sunflower  and peanut. Check the labels, and avoid partially hydrogenated oil, which have unhealthy trans fats.   If youre thirsty, drink water. Coffee and tea are good in moderation, but skip sugary drinks and limit milk and dairy products to one or two daily servings.   The type of carbohydrate in the diet is more important than the amount. Some sources of carbohydrates, such as vegetables, fruits, whole grains, and beans-are healthier than others.   Finally, stay active  Signed, Berniece Salines, DO  10/20/2019 3:20 PM    [facility]

## 2019-10-20 NOTE — Progress Notes (Signed)
Cardiology Office Note:    Date:  10/20/2019   ID:  Luis Hood, DOB 08/26/1946, MRN [id number]  PCP:  Townsend Roger, MD  Cardiologist:  Berniece Salines, DO  Electrophysiologist:  None   Referring MD: Nona Dell, Corene Cornea, MD   The patient was referred for abnormal stress test and   History of Present Illness:    Luis Hood is a 74 y.o. male with a hx of hypertension, hyperlipidemia, former smoker family history of coronary disease presented to be evaluated for chest pain as well as abnormal stress test.  Patient tells me that even prior to his Covid infection he has been experiencing left-sided dull intermittent chest pain which sometimes radiates to his neck.  He did speak with his primary care physician at which time he was recommended to undergo a pharmacologic nuclear stress test on September 16, 2019.  Stress test showed fixed defect of moderate severity at the inferolateral wall however was noted that the patient has significant frequent PVCs ejection fraction 43%. He was then admitted on December 30 for COVID-19 infection. he has been treated.    He tells me that since he recovered he now again feels the same chest pain he used to feel prior to his hospital admission.  He admits associated shortness of breath even before his Covid infection.  Past Medical History:  Diagnosis Date  . Chest pain 10/20/2019  . Corn of toe 05/13/2016  . COVID-19 10/20/2019  . Dyslipidemia 10/20/2019  . Dyspnea on exertion 10/20/2019  . Essential hypertension 12/20/2014  . 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 Tinea pedis of both feet 05/13/2016    Past Surgical History:  Procedure Laterality Date  . CATARACT EXTRACTION Bilateral   . HERNIA REPAIR    . REPLACEMENT TOTAL KNEE Bilateral   . TONSILLECTOMY      Current Medications: Current Meds  Medication Sig  . albuterol (VENTOLIN HFA) 108 (90 Base) MCG/ACT inhaler Inhale into the lungs.  Marland Kitchen amLODipine (NORVASC) 10 MG tablet Take 10 mg by mouth.  Marland Kitchen  aspirin 81 MG EC tablet Take 81 mg by mouth.  . fluticasone (FLOVENT HFA) 110 MCG/ACT inhaler Inject 2 puffs into the muscle.  . lisinopril (ZESTRIL) 5 MG tablet Take 5 mg by mouth daily.  . meclizine (ANTIVERT) 25 MG tablet Take 12.5 mg by mouth.  . omega-3 acid ethyl esters (LOVAZA) 1 g capsule Take by mouth 2 (two) times daily.  . Omeprazole 20 MG TBEC 20 mg daily.  . rosuvastatin (CRESTOR) 10 MG tablet Take 20 mg by mouth.     Allergies:   Patient has no known allergies.   Social History   Socioeconomic History  . Marital status: Divorced    Spouse name: Not on file  . Number of children: Not on file  . Years of education: Not on file  . Highest education level: Not on file  Occupational History  . Not on file  Tobacco Use  . Smoking status: Former Smoker    Types: Cigarettes    Quit date: 2020    Years since quitting: 1.0  . Smokeless tobacco: Never Used  Substance and Sexual Activity  . Alcohol use: Yes    Alcohol/week: 2.0 standard drinks    Types: 2 Shots of liquor per week  . Drug use: Never  . Sexual activity: Not on file  Other Topics Concern  . Not on file  Social History Narrative  . Not on file   Social Determinants  of Health   Financial Resource Strain:   . Difficulty of Paying Living Expenses: Not on file  Food Insecurity:   . Worried About Charity fundraiser in the Last Year: Not on file  . Ran Out of Food in the Last Year: Not on file  Transportation Needs:   . Lack of Transportation (Medical): Not on file  . Lack of Transportation (Non-Medical): Not on file  Physical Activity:   . Days of Exercise per Week: Not on file  . Minutes of Exercise per Session: Not on file  Stress:   . Feeling of Stress : Not on file  Social Connections:   . Frequency of Communication with Friends and Family: Not on file  . Frequency of Social Gatherings with Friends and Family: Not on file  . Attends Religious Services: Not on file  . Active Member of Clubs or  Organizations: Not on file  . Attends Archivist Meetings: Not on file  . Marital Status: Not on file     Family History: The patient's family history includes Seizures in his mother.  ROS:   Review of Systems  Constitution: Negative for decreased appetite, fever and weight gain.  HENT: Negative for congestion, ear discharge, hoarse voice and sore throat.   Eyes: Negative for discharge, redness, vision loss in right eye and visual halos.  Cardiovascular: Reports chest pain and shortness of breath.  Negative forleg swelling, orthopnea and palpitations.  Respiratory: Negative for cough, hemoptysis, shortness of breath and snoring.   Endocrine: Negative for heat intolerance and polyphagia.  Hematologic/Lymphatic: Negative for bleeding problem. Does not bruise/bleed easily.  Skin: Negative for flushing, nail changes, rash and suspicious lesions.  Musculoskeletal: Negative for arthritis, joint pain, muscle cramps, myalgias, neck pain and stiffness.  Gastrointestinal: Negative for abdominal pain, bowel incontinence, diarrhea and excessive appetite.  Genitourinary: Negative for decreased libido, genital sores and incomplete emptying.  Neurological: Negative for brief paralysis, focal weakness, headaches and loss of balance.  Psychiatric/Behavioral: Negative for altered mental status, depression and suicidal ideas.  Allergic/Immunologic: Negative for HIV exposure and persistent infections.    EKGs/Labs/Other Studies Reviewed:    The following studies were reviewed today:   EKG:  The ekg ordered today demonstrates sinus bradycardia, with premature atrial complexes, right bundle branch block and left anterior fascicular block.  Pharmacologic nuclear stress test with fixed defect of moderate severity at the inferolateral wall at the apex.  Ejection fraction 43%.  Of note significant fusion PVC during the stress portion of the test.  Transthoracic echocardiogram 10/06/2019 Low  normal left ventricular ejection fraction 50 to 55%.  Paradoxical motion consistent with left bundle branch block.  Left atrium normal size, right atrium normal size.  Mild mitral regurgitation.  Trace tricuspid regurgitation.  Aortic root and aortic arch well visualized.  Recent Labs: No results found for requested labs within last 8760 hours.  Recent Lipid Panel No results found for: CHOL, TRIG, HDL, CHOLHDL, VLDL, LDLCALC, LDLDIRECT  Physical Exam:    VS:  BP 130/68 (BP Location: Left Arm, Patient Position: Sitting, Cuff Size: Normal)   Pulse (!) 59   Ht 5\' 11"  (1.803 m)   Wt 203 lb (92.1 kg)   SpO2 94%   BMI 28.31 kg/m     Wt Readings from Last 3 Encounters:  10/20/19 203 lb (92.1 kg)     GEN: Well nourished, well developed in no acute distress HEENT: Normal NECK: No JVD; No carotid bruits LYMPHATICS: No lymphadenopathy CARDIAC: S1S2  noted,RRR, no murmurs, rubs, gallops RESPIRATORY:  Clear to auscultation without rales, wheezing or rhonchi  ABDOMEN: Soft, non-tender, non-distended, +bowel sounds, no guarding. EXTREMITIES: No edema, No cyanosis, no clubbing MUSCULOSKELETAL:  No edema; No deformity  SKIN: Warm and dry NEUROLOGIC:  Alert and oriented x 3, non-focal PSYCHIATRIC:  Normal affect, good insight  ASSESSMENT:    1. Chest pain of uncertain etiology   2. Abnormal stress test   3. Essential hypertension   4. Pre-procedure lab exam   5. Mixed hyperlipidemia    PLAN:     His recent stress test was reported as abnormal, and the patient is also expressing that he has chest pain on a nightly basis.  Therefore at this time I would recommend the patient pursue left heart catheterization.  The patient understands that risks include but are not limited to stroke (1 in 1000), death (1 in 70), kidney failure [usually temporary] (1 in 500), bleeding (1 in 200), allergic reaction [possibly serious] (1 in 200), and agrees to proceed.  Sublingual nitroglycerin prescription  was sent, its protocol and 911 protocol explained and the patient vocalized understanding questions were answered to the patient's satisfaction.  Continue patient on aspirin 81 mg daily and Crestor 10 g daily.  His blood pressure is appropriate in the office today continue patient on his lisinopril  as well as his amlodipine 10 mg daily.  Lab work will be done today.  The patient is in agreement with the above plan. The patient left the office in stable condition.  The patient will follow up in 1 month.   Medication Adjustments/Labs and Tests Ordered: Current medicines are reviewed at length with the patient today.  Concerns regarding medicines are outlined above.  Orders Placed This Encounter  Procedures  . CBC  . Basic Metabolic Panel (BMET)  . EKG 12-Lead   Meds ordered this encounter  Medications  . nitroGLYCERIN (NITROSTAT) 0.4 MG SL tablet    Sig: Place 1 tablet (0.4 mg total) under the tongue every 5 (five) minutes as needed.    Dispense:  30 tablet    Refill:  3    Patient Instructions  Medication Instructions:  Your physician has recommended you make the following change in your medication:  Nitroglycerin 0.4 mg sublingual (under your tongue) as needed for chest pain. If experiencing chest pain, stop what you are doing and sit down. Take 1 nitroglycerin and wait 5 minutes. If chest pain continues, take another nitroglycerin and wait 5 minutes. If chest pain does not subside, take 1 more nitroglycerin and dial 911. You make take a total of 3 nitroglycerin in a 15 minute time frame. t.  *If you need a refill on your cardiac medications before your next appointment, please call your pharmacy*  Lab Work: Your physician recommends that you return for lab work in: [place]  If you have labs (blood work) drawn today and your tests are completely normal, you will receive your results only by: Marland Kitchen MyChart Message (if you have MyChart) OR . A paper copy in the mail If you  have any lab test that is abnormal or we need to change your treatment, we will call you to review the results.  Testing/Procedures:    [place] [address] Dept: [phone] Loc: [phone]  Wille Fordham  10/20/2019  You are scheduled for a Cardiac Catheterization on Thursday, January 21 with Dr. [name].  1. Please arrive at the [facility] (Main Entrance A) at [facility]: [address] [place], [place] [address] at 10:30 AM (This time is two hours before your procedure to ensure your preparation). Free valet parking service is available.   Special note: Every effort is made to have your procedure done on time. Please understand that emergencies sometimes delay scheduled procedures.  2. Diet: Do not eat solid foods after midnight.  The patient may have clear liquids until 5am upon the day of the procedure.  3. Labs: None needed  4. Medication instructions in preparation for your procedure:   Contrast Allergy: No  On the morning of your procedure, take your Aspirin and any morning medicines NOT listed above.  You may use sips of water.  5. Plan for one night stay--bring personal belongings. 6. Bring a current list of your medications and current insurance cards. 7. You MUST have a responsible person to drive you home. 8. Someone MUST be with you the first 24 hours after you arrive home or your discharge will be delayed. 9. Please wear clothes that are easy to get on and off and wear slip-on shoes.  Thank you for allowing [place] to care for you!   -- [place] [facility]  Follow-Up: At [facility], you and your health needs are our priority.  As part of our continuing mission to provide you with exceptional heart care, we have created designated Provider Care Teams.  These Care Teams include your primary Cardiologist (physician)  and Advanced Practice Providers (APPs -  Physician Assistants and Nurse Practitioners) who all work together to provide you with the care you need, when you need it.  Your next appointment:   1 month(s)  The format for your next appointment:   In Person  Provider:   Berniece Salines, DO  Other Instructions      Adopting a Healthy Lifestyle.  Know what a healthy weight is for you (roughly BMI <25) and aim to maintain this   Aim for 7+ servings of fruits and vegetables daily   65-80+ fluid ounces of water or unsweet tea for healthy kidneys   Limit to max 1 drink of alcohol per day; avoid smoking/tobacco   Limit animal fats in diet for cholesterol and heart health - choose grass fed whenever available   Avoid highly processed foods, and foods high in saturated/trans fats   Aim for low stress - take time to unwind and care for your mental health   Aim for 150 min of moderate intensity exercise weekly for heart health, and weights twice weekly for bone health   Aim for 7-9 hours of sleep daily   When it comes to diets, agreement about the perfect plan isnt easy to find, even among the experts. Experts at the [employer] developed an idea known as the Healthy Eating Plate. Just imagine a plate divided into logical, healthy portions.   The emphasis is on diet quality:   Load up on vegetables and fruits - one-half of your plate: Aim for color and variety, and remember that potatoes dont count.   Go for whole grains - one-quarter of your plate: Whole wheat, barley, wheat berries, quinoa, oats, brown rice, and foods made with them. If you want pasta, go with whole wheat pasta.   Protein power - one-quarter of your plate: Fish, chicken, beans, and nuts are all healthy, versatile protein sources. Limit red meat.   The diet, however,  does go beyond the plate, offering a few other suggestions.   Use healthy plant oils, such as olive, canola, soy, corn, sunflower  and peanut. Check the labels, and avoid partially hydrogenated oil, which have unhealthy trans fats.   If youre thirsty, drink water. Coffee and tea are good in moderation, but skip sugary drinks and limit milk and dairy products to one or two daily servings.   The type of carbohydrate in the diet is more important than the amount. Some sources of carbohydrates, such as vegetables, fruits, whole grains, and beans-are healthier than others.   Finally, stay active  Signed, Berniece Salines, DO  10/20/2019 3:20 PM    [facility]

## 2019-10-21 LAB — CBC
Hematocrit: 36.7 % — ABNORMAL LOW (ref 37.5–51.0)
Hemoglobin: 12.3 g/dL — ABNORMAL LOW (ref 13.0–17.7)
MCH: 28.1 pg (ref 26.6–33.0)
MCHC: 33.5 g/dL (ref 31.5–35.7)
MCV: 84 fL (ref 79–97)
Platelets: 434 10*3/uL (ref 150–450)
RBC: 4.37 x10E6/uL (ref 4.14–5.80)
RDW: 12.7 % (ref 11.6–15.4)
WBC: 7.8 10*3/uL (ref 3.4–10.8)

## 2019-10-21 LAB — BASIC METABOLIC PANEL
BUN/Creatinine Ratio: 18 (ref 10–24)
BUN: 19 mg/dL (ref 8–27)
CO2: 26 mmol/L (ref 20–29)
Calcium: 9.5 mg/dL (ref 8.6–10.2)
Chloride: 107 mmol/L — ABNORMAL HIGH (ref 96–106)
Creatinine, Ser: 1.08 mg/dL (ref 0.76–1.27)
GFR calc Af Amer: 78 mL/min/{1.73_m2} (ref >59)
GFR calc non Af Amer: 68 mL/min/{1.73_m2} (ref >59)
Glucose: 80 mg/dL (ref 65–99)
Potassium: 4.9 mmol/L (ref 3.5–5.2)
Sodium: 145 mmol/L — ABNORMAL HIGH (ref 134–144)

## 2019-10-21 NOTE — Addendum Note (Signed)
Addended by: Berniece Salines on: 10/21/2019 04:14 PM   Modules accepted: Orders, SmartSet

## 2019-10-22 ENCOUNTER — Telehealth: Payer: Self-pay | Admitting: *Deleted

## 2019-10-22 NOTE — Telephone Encounter (Signed)
-----   Message from Berniece Salines, DO sent at 10/21/2019 10:37 PM EST ----- Hemoglobin slightly lower, sodium slightly higher

## 2019-10-22 NOTE — Telephone Encounter (Signed)
Telephone call to patient. Left message with lab results and to call with any questions

## 2019-10-24 ENCOUNTER — Encounter (HOSPITAL_COMMUNITY): Payer: Self-pay | Admitting: Cardiology

## 2019-10-26 ENCOUNTER — Telehealth: Payer: Self-pay | Admitting: *Deleted

## 2019-10-26 ENCOUNTER — Encounter: Payer: Self-pay | Admitting: Cardiology

## 2019-10-26 NOTE — Telephone Encounter (Addendum)
Pt contacted pre-catheterization scheduled at [facility] for: Verified arrival time and place: [facility]) at:   No solid food after midnight prior to cath, clear liquids until 5 AM day of procedure. Contrast allergy: no Verified no diabetes medications.  AM meds can be  taken pre-cath with sip of water including: ASA 81 mg   Confirmed patient has responsible adult to drive home post procedure and observe 24 hours after arriving home: yes  Currently, due to Covid-19 pandemic, only one support person will be allowed with patient. Must be the same support person for that patient's entire stay and will be required to wear a mask. They will be asked to wait in the waiting room for the duration of the patient's stay.  Patients are required to wear a mask when they enter the hospital.  Pt states he was an inpatient at [facility] and tested positive for Covid-25 September 2019. The positive Covid-19 test result 10/05/19 is scanned in Epic under Media tab.  In the past 7 to 10 days have you had a cough,  shortness of breath, headache, congestion, fever (100 or greater) body aches, chills, sore throat, or sudden loss of taste or sense of smell? Pt states he has had shortness of breath even before he had Covid-19, denies fever.              Pt states he was an inpatient at [facility] and tested positive for Covid-25 September 2019.

## 2019-10-28 ENCOUNTER — Ambulatory Visit (HOSPITAL_COMMUNITY)
Admission: RE | Admit: 2019-10-28 | Discharge: 2019-10-28 | Disposition: A | Discharge status: Home / Self Care | Payer: Medicare Other | Attending: Cardiology | Admitting: Cardiology

## 2019-10-28 ENCOUNTER — Encounter (HOSPITAL_COMMUNITY)
Admission: RE | Disposition: A | Discharge status: Home / Self Care | Payer: Self-pay | Source: Home / Self Care | Attending: Cardiology

## 2019-10-28 ENCOUNTER — Other Ambulatory Visit: Payer: Self-pay

## 2019-10-28 DIAGNOSIS — I25118 Atherosclerotic heart disease of native coronary artery with other forms of angina pectoris: Secondary | ICD-10-CM | POA: Diagnosis not present

## 2019-10-28 DIAGNOSIS — R0602 Shortness of breath: Secondary | ICD-10-CM | POA: Insufficient documentation

## 2019-10-28 DIAGNOSIS — Z96653 Presence of artificial knee joint, bilateral: Secondary | ICD-10-CM | POA: Diagnosis not present

## 2019-10-28 DIAGNOSIS — Z87891 Personal history of nicotine dependence: Secondary | ICD-10-CM | POA: Insufficient documentation

## 2019-10-28 DIAGNOSIS — E782 Mixed hyperlipidemia: Secondary | ICD-10-CM | POA: Diagnosis not present

## 2019-10-28 DIAGNOSIS — Z79899 Other long term (current) drug therapy: Secondary | ICD-10-CM | POA: Insufficient documentation

## 2019-10-28 DIAGNOSIS — I10 Essential (primary) hypertension: Secondary | ICD-10-CM | POA: Diagnosis not present

## 2019-10-28 DIAGNOSIS — R9439 Abnormal result of other cardiovascular function study: Secondary | ICD-10-CM

## 2019-10-28 DIAGNOSIS — Z8616 Personal history of COVID-19: Secondary | ICD-10-CM | POA: Insufficient documentation

## 2019-10-28 DIAGNOSIS — Z7982 Long term (current) use of aspirin: Secondary | ICD-10-CM | POA: Diagnosis not present

## 2019-10-28 DIAGNOSIS — Z7951 Long term (current) use of inhaled steroids: Secondary | ICD-10-CM | POA: Diagnosis not present

## 2019-10-28 HISTORY — PX: LEFT HEART CATH AND CORONARY ANGIOGRAPHY: CATH118249

## 2019-10-28 SURGERY — LEFT HEART CATH AND CORONARY ANGIOGRAPHY
Anesthesia: LOCAL

## 2019-10-28 MED ORDER — HEPARIN (PORCINE) IN NACL 1000-0.9 UT/500ML-% IV SOLN
INTRAVENOUS | Status: DC | PRN
  Administered 2019-10-28: 500 mL

## 2019-10-28 MED ORDER — HEPARIN (PORCINE) IN NACL 1000-0.9 UT/500ML-% IV SOLN
INTRAVENOUS | Status: AC
  Filled 2019-10-28: qty 1000

## 2019-10-28 MED ORDER — VERAPAMIL HCL 2.5 MG/ML IV SOLN
INTRAVENOUS | Status: AC
  Filled 2019-10-28: qty 2

## 2019-10-28 MED ORDER — HYDRALAZINE HCL 20 MG/ML IJ SOLN: 10.0000 mg | INTRAMUSCULAR | Status: DC | PRN

## 2019-10-28 MED ORDER — HEPARIN SODIUM (PORCINE) 1000 UNIT/ML IJ SOLN
INTRAMUSCULAR | Status: DC | PRN
  Administered 2019-10-28: 4500 [IU] via INTRAVENOUS

## 2019-10-28 MED ORDER — SODIUM CHLORIDE 0.9% FLUSH: 3.0000 mL | INTRAVENOUS | Status: DC | PRN

## 2019-10-28 MED ORDER — FENTANYL CITRATE (PF) 100 MCG/2ML IJ SOLN
INTRAMUSCULAR | Status: DC | PRN
  Administered 2019-10-28: 25 ug via INTRAVENOUS

## 2019-10-28 MED ORDER — SODIUM CHLORIDE 0.9% FLUSH: 3.0000 mL | Freq: Two times a day (BID) | INTRAVENOUS | Status: DC

## 2019-10-28 MED ORDER — MIDAZOLAM HCL 2 MG/2ML IJ SOLN
INTRAMUSCULAR | Status: DC | PRN
  Administered 2019-10-28: 1 mg via INTRAVENOUS

## 2019-10-28 MED ORDER — ACETAMINOPHEN 325 MG PO TABS: 650.0000 mg | ORAL_TABLET | ORAL | Status: DC | PRN

## 2019-10-28 MED ORDER — SODIUM CHLORIDE 0.9 % IV SOLN: 250.0000 mL | INTRAVENOUS | Status: DC | PRN

## 2019-10-28 MED ORDER — LABETALOL HCL 5 MG/ML IV SOLN: 10.0000 mg | INTRAVENOUS | Status: DC | PRN

## 2019-10-28 MED ORDER — ONDANSETRON HCL 4 MG/2ML IJ SOLN: 4.0000 mg | Freq: Four times a day (QID) | INTRAMUSCULAR | Status: DC | PRN

## 2019-10-28 MED ORDER — LIDOCAINE IN D5W 4-5 MG/ML-% IV SOLN
INTRAVENOUS | Status: AC
  Filled 2019-10-28: qty 500

## 2019-10-28 MED ORDER — HEPARIN SODIUM (PORCINE) 1000 UNIT/ML IJ SOLN
INTRAMUSCULAR | Status: AC
  Filled 2019-10-28: qty 1

## 2019-10-28 MED ORDER — LIDOCAINE HCL (PF) 1 % IJ SOLN
INTRAMUSCULAR | Status: AC
  Filled 2019-10-28: qty 30

## 2019-10-28 MED ORDER — FENTANYL CITRATE (PF) 100 MCG/2ML IJ SOLN
INTRAMUSCULAR | Status: AC
  Filled 2019-10-28: qty 2

## 2019-10-28 MED ORDER — VERAPAMIL HCL 2.5 MG/ML IV SOLN
INTRAVENOUS | Status: DC | PRN
  Administered 2019-10-28: 10 mL via INTRA_ARTERIAL

## 2019-10-28 MED ORDER — SODIUM CHLORIDE 0.9 % IV SOLN: INTRAVENOUS | Status: DC

## 2019-10-28 MED ORDER — LIDOCAINE HCL (PF) 1 % IJ SOLN
INTRAMUSCULAR | Status: DC | PRN
  Administered 2019-10-28: 2 mL

## 2019-10-28 MED ORDER — MIDAZOLAM HCL 2 MG/2ML IJ SOLN
INTRAMUSCULAR | Status: AC
  Filled 2019-10-28: qty 2

## 2019-10-28 MED ORDER — SODIUM CHLORIDE 0.9 % WEIGHT BASED INFUSION
3.0000 mL/kg/h | INTRAVENOUS | Status: DC
  Administered 2019-10-28: 3 mL/kg/h via INTRAVENOUS

## 2019-10-28 MED ORDER — SODIUM CHLORIDE 0.9 % WEIGHT BASED INFUSION: 1.0000 mL/kg/h | INTRAVENOUS | Status: DC

## 2019-10-28 MED ORDER — IOHEXOL 350 MG/ML SOLN
INTRAVENOUS | Status: DC | PRN
  Administered 2019-10-28: 85 mL

## 2019-10-28 SURGICAL SUPPLY — 12 items
CATH INFINITI 5FR ANG PIGTAIL (CATHETERS) ×2 IMPLANT
CATH OPTITORQUE TIG 4.0 5F (CATHETERS) ×2 IMPLANT
DEVICE RAD COMP TR BAND LRG (VASCULAR PRODUCTS) ×2 IMPLANT
GLIDESHEATH SLEND SS 6F .021 (SHEATH) ×2 IMPLANT
GUIDEWIRE INQWIRE 1.5J.035X260 (WIRE) ×1 IMPLANT
INQWIRE 1.5J .035X260CM (WIRE) ×2
KIT HEART LEFT (KITS) ×2 IMPLANT
PACK CARDIAC CATHETERIZATION (CUSTOM PROCEDURE TRAY) ×2 IMPLANT
SHEATH PROBE COVER 6X72 (BAG) ×2 IMPLANT
SYR MEDRAD MARK 7 150ML (SYRINGE) ×2 IMPLANT
TRANSDUCER W/STOPCOCK (MISCELLANEOUS) ×2 IMPLANT
TUBING CIL FLEX 10 FLL-RA (TUBING) ×2 IMPLANT

## 2019-10-28 NOTE — Discharge Instructions (Signed)
Radial Site Care  This sheet gives you information about how to care for yourself after your procedure. Your health care provider may also give you more specific instructions. If you have problems or questions, contact your health care provider. What can I expect after the procedure? After the procedure, it is common to have:  Bruising and tenderness at the catheter insertion area. Follow these instructions at home: Medicines  Take over-the-counter and prescription medicines only as told by your health care provider. Insertion site care  Follow instructions from your health care provider about how to take care of your insertion site. Make sure you: ? Wash your hands with soap and water before you change your bandage (dressing). If soap and water are not available, use hand sanitizer. ? Change your dressing as told by your health care provider. ? Leave stitches (sutures), skin glue, or adhesive strips in place. These skin closures may need to stay in place for 2 weeks or longer. If adhesive strip edges start to loosen and curl up, you may trim the loose edges. Do not remove adhesive strips completely unless your health care provider tells you to do that.  Check your insertion site every day for signs of infection. Check for: ? Redness, swelling, or pain. ? Fluid or blood. ? Pus or a bad smell. ? Warmth.  Do not take baths, swim, or use a hot tub until your health care provider approves.  You may shower 24-48 hours after the procedure, or as directed by your health care provider. ? Remove the dressing and gently wash the site with plain soap and water. ? Pat the area dry with a clean towel. ? Do not rub the site. That could cause bleeding.  Do not apply powder or lotion to the site. Activity   For 24 hours after the procedure, or as directed by your health care provider: ? Do not flex or bend the affected arm. ? Do not push or pull heavy objects with the affected arm. ? Do not  drive yourself home from the hospital or clinic. You may drive 24 hours after the procedure unless your health care provider tells you not to. ? Do not operate machinery or power tools.  Do not lift anything that is heavier than 10 lb (4.5 kg), or the limit that you are told, until your health care provider says that it is safe.  Ask your health care provider when it is okay to: ? Return to work or school. ? Resume usual physical activities or sports. ? Resume sexual activity. General instructions  If the catheter site starts to bleed, raise your arm and put firm pressure on the site. If the bleeding does not stop, get help right away. This is a medical emergency.  If you went home on the same day as your procedure, a responsible adult should be with you for the first 24 hours after you arrive home.  Keep all follow-up visits as told by your health care provider. This is important. Contact a health care provider if:  You have a fever.  You have redness, swelling, or yellow drainage around your insertion site. Get help right away if:  You have unusual pain at the radial site.  The catheter insertion area swells very fast.  The insertion area is bleeding, and the bleeding does not stop when you hold steady pressure on the area.  Your arm or hand becomes pale, cool, tingly, or numb. These symptoms may represent a serious problem   that is an emergency. Do not wait to see if the symptoms will go away. Get medical help right away. Call your local emergency services (911 in the [place] Do not drive yourself to the hospital. Summary  After the procedure, it is common to have bruising and tenderness at the site.  Follow instructions from your health care provider about how to take care of your radial site wound. Check the wound every day for signs of infection.  Do not lift anything that is heavier than 10 lb (4.5 kg), or the limit that you are told, until your health care provider says  that it is safe. This information is not intended to replace advice given to you by your health care provider. Make sure you discuss any questions you have with your health care provider. Document Revised: 10/29/2017 Document Reviewed: 10/29/2017 Elsevier Patient Education  2020 [employer]  

## 2019-10-28 NOTE — Interval H&P Note (Signed)
History and Physical Interval Note:  10/28/2019 3:18 PM  Luis Hood  has presented today for surgery, with the diagnosis of Abnormal stress test.  The various methods of treatment have been discussed with the patient and family. After consideration of risks, benefits and other options for treatment, the patient has consented to  Procedure(s): LEFT HEART CATH AND CORONARY ANGIOGRAPHY (N/A)  PERCUTANEOUS CORONARY INTERVENTION  as a surgical intervention.  The patient's history has been reviewed, patient examined, no change in status, stable for surgery.  I have reviewed the patient's chart and labs.  Questions were answered to the patient's satisfaction.    Cath Lab Visit (complete for each Cath Lab visit)  Clinical Evaluation Leading to the Procedure:   ACS: No.  Non-ACS:    Anginal Classification: CCS II  Anti-ischemic medical therapy: Minimal Therapy (1 class of medications)  Non-Invasive Test Results: Intermediate-risk stress test findings: cardiac mortality 1-3%/year  Prior CABG: No previous CABG    Luis Hood

## 2019-11-22 ENCOUNTER — Ambulatory Visit (INDEPENDENT_AMBULATORY_CARE_PROVIDER_SITE_OTHER): Payer: Medicare Other | Admitting: Cardiology

## 2019-11-22 ENCOUNTER — Ambulatory Visit (INDEPENDENT_AMBULATORY_CARE_PROVIDER_SITE_OTHER): Payer: Medicare Other

## 2019-11-22 ENCOUNTER — Encounter: Payer: Self-pay | Admitting: Cardiology

## 2019-11-22 ENCOUNTER — Other Ambulatory Visit: Payer: Self-pay

## 2019-11-22 VITALS — BP 110/78 | HR 52 | Ht 71.0 in | Wt 207.0 lb

## 2019-11-22 DIAGNOSIS — E782 Mixed hyperlipidemia: Secondary | ICD-10-CM | POA: Diagnosis not present

## 2019-11-22 DIAGNOSIS — I2510 Atherosclerotic heart disease of native coronary artery without angina pectoris: Secondary | ICD-10-CM

## 2019-11-22 DIAGNOSIS — R002 Palpitations: Secondary | ICD-10-CM

## 2019-11-22 MED ORDER — ROSUVASTATIN CALCIUM 20 MG PO TABS: 20.0000 mg | ORAL_TABLET | Freq: Every day | ORAL | 3 refills | Status: DC

## 2019-11-22 NOTE — Progress Notes (Signed)
Cardiology Office Note:    Date:  11/22/2019   ID:  Luis Hood, DOB 1945/10/25, MRN [id number]  PCP:  Luis Roger, MD  Cardiologist:  Luis Salines, DO  Electrophysiologist:  None   Referring MD: Luis Roger, MD   Chief Complaint  Patient presents with  . Follow-up    History of Present Illness:    Luis Hood is a 74 y.o. male with a hx of recently diagnosed coronary artery disease, hypertension, hyperlipidemia, former smoking who initially presented on October 20, 2019 to establish cardiac care.    Prior to his initial visit he was hospitalized at the [facility] for COVID-19.  He was treated and discharged home.  In addition he did undergo a pharmacologic stress test on September 26, 2019 which show evidence of moderate inferolateral wall ischemia.  And frequent PVCs noted with a EF of 43%.  At his visit giving his report of chest pain he was recommended to undergo cardiac catheterization.  The patient did undergo cardiac catheterization which showed proximal to mid 20% LAD stenosis.  His EF was 45 to 50% his LV was described to be somewhat dilated with thickened myocardium.  Today he is here for follow-up visit and tells me that he has been experiencing some shortness of breath as well as worsening palpitations.  He denies any chest pain  Past Medical History:  Diagnosis Date  . Chest pain 10/20/2019  . Corn of toe 05/13/2016  . COVID-19 10/06/2019  . Dyslipidemia 10/20/2019  . Dyspnea on exertion 10/20/2019  . Essential hypertension 12/20/2014  . 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 Tinea pedis of both feet 05/13/2016    Past Surgical History:  Procedure Laterality Date  . CATARACT EXTRACTION Bilateral   . HERNIA REPAIR    . LEFT HEART CATH AND CORONARY ANGIOGRAPHY N/A 10/28/2019   Procedure: LEFT HEART CATH AND CORONARY ANGIOGRAPHY;  Surgeon: Luis Man, MD;  Location: [facility];  Service: Cardiovascular;  Laterality: N/A;  .  REPLACEMENT TOTAL KNEE Bilateral   . TONSILLECTOMY      Current Medications: Current Meds  Medication Sig  . acetaminophen (TYLENOL) 500 MG tablet Take 1,000 mg by mouth every 6 (six) hours as needed for moderate pain or headache.  . albuterol (VENTOLIN HFA) 108 (90 Base) MCG/ACT inhaler Inhale 1-2 puffs into the lungs every 6 (six) hours as needed for wheezing or shortness of breath.   Marland Kitchen aspirin 81 MG EC tablet Take 81 mg by mouth daily.   Marland Kitchen lisinopril (ZESTRIL) 5 MG tablet Take 5 mg by mouth daily.  . Multiple Vitamin (MULTIVITAMIN WITH MINERALS) TABS tablet Take 1 tablet by mouth daily.  . Naphazoline-Glycerin (REDNESS RELIEF OP) Place 1 drop into both eyes daily as needed (redness/ irritation).  . nitroGLYCERIN (NITROSTAT) 0.4 MG SL tablet Place 1 tablet (0.4 mg total) under the tongue every 5 (five) minutes as needed.  . Omega-3 Fatty Acids (FISH OIL) 1000 MG CAPS Take 1,000 mg by mouth daily.  Marland Kitchen omeprazole (PRILOSEC) 20 MG capsule Take 20 mg by mouth daily.  . [DISCONTINUED] rosuvastatin (CRESTOR) 10 MG tablet Take 20 mg by mouth at bedtime.      Allergies:   Patient has no known allergies.   Social History   Socioeconomic History  . Marital status: Divorced    Spouse name: Not on file  . Number of children: Not on file  . Years of education: Not on file  . Highest  education level: Not on file  Occupational History  . Not on file  Tobacco Use  . Smoking status: Former Smoker    Types: Cigarettes    Quit date: 2020    Years since quitting: 1.1  . Smokeless tobacco: Never Used  Substance and Sexual Activity  . Alcohol use: Yes    Alcohol/week: 2.0 standard drinks    Types: 2 Shots of liquor per week  . Drug use: Never  . Sexual activity: Not on file  Other Topics Concern  . Not on file  Social History Narrative  . Not on file   Social Determinants of Health   Financial Resource Strain:   . Difficulty of Paying Living Expenses: Not on file  Food Insecurity:   .  Worried About Charity fundraiser in the Last Year: Not on file  . Ran Out of Food in the Last Year: Not on file  Transportation Needs:   . Lack of Transportation (Medical): Not on file  . Lack of Transportation (Non-Medical): Not on file  Physical Activity:   . Days of Exercise per Week: Not on file  . Minutes of Exercise per Session: Not on file  Stress:   . Feeling of Stress : Not on file  Social Connections:   . Frequency of Communication with Friends and Family: Not on file  . Frequency of Social Gatherings with Friends and Family: Not on file  . Attends Religious Services: Not on file  . Active Member of Clubs or Organizations: Not on file  . Attends Archivist Meetings: Not on file  . Marital Status: Not on file     Family History: The patient's family history includes Seizures in his mother.  ROS:   Review of Systems  Constitution: Negative for decreased appetite, fever and weight gain.  HENT: Negative for congestion, ear discharge, hoarse voice and sore throat.   Eyes: Negative for discharge, redness, vision loss in right eye and visual halos.  Cardiovascular: Negative for chest pain, dyspnea on exertion, leg swelling, orthopnea and palpitations.  Respiratory: Negative for cough, hemoptysis, shortness of breath and snoring.   Endocrine: Negative for heat intolerance and polyphagia.  Hematologic/Lymphatic: Negative for bleeding problem. Does not bruise/bleed easily.  Skin: Negative for flushing, nail changes, rash and suspicious lesions.  Musculoskeletal: Negative for arthritis, joint pain, muscle cramps, myalgias, neck pain and stiffness.  Gastrointestinal: Negative for abdominal pain, bowel incontinence, diarrhea and excessive appetite.  Genitourinary: Negative for decreased libido, genital sores and incomplete emptying.  Neurological: Negative for brief paralysis, focal weakness, headaches and loss of balance.  Psychiatric/Behavioral: Negative for altered  mental status, depression and suicidal ideas.  Allergic/Immunologic: Negative for HIV exposure and persistent infections.    EKGs/Labs/Other Studies Reviewed:    The following studies were reviewed today:   EKG:  None today  Pharmacologic nuclear stress test with fixed defect of moderate severity at the inferolateral wall at the apex.  Ejection fraction 43%.  Of note significant fusion PVC during the stress portion of the test.  Transthoracic echocardiogram 10/06/2019 Low normal left ventricular ejection fraction 50 to 55%.  Paradoxical motion consistent with left bundle branch block.  Left atrium normal size, right atrium normal size.  Mild mitral regurgitation.  Trace tricuspid regurgitation.  Aortic root and aortic arch well visualized.  Recent Labs: 10/20/2019: BUN 19; Creatinine, Ser 1.08; Hemoglobin 12.3; Platelets 434; Potassium 4.9; Sodium 145  Recent Lipid Panel No results found for: CHOL, TRIG, HDL, CHOLHDL, VLDL, LDLCALC,  LDLDIRECT  Physical Exam:    VS:  BP 110/78 (BP Location: Left Arm, Patient Position: Sitting, Cuff Size: Normal)   Pulse (!) 52   Ht 5\' 11"  (1.803 m)   Wt 207 lb (93.9 kg)   SpO2 94%   BMI 28.87 kg/m     Wt Readings from Last 3 Encounters:  11/22/19 207 lb (93.9 kg)  10/28/19 203 lb (92.1 kg)  10/20/19 203 lb (92.1 kg)     GEN: Well nourished, well developed in no acute distress HEENT: Normal NECK: No JVD; No carotid bruits LYMPHATICS: No lymphadenopathy CARDIAC: S1S2 noted,RRR, no murmurs, rubs, gallops RESPIRATORY:  Clear to auscultation without rales, wheezing or rhonchi  ABDOMEN: Soft, non-tender, non-distended, +bowel sounds, no guarding. EXTREMITIES: No edema, No cyanosis, no clubbing MUSCULOSKELETAL:  No deformity  SKIN: Warm and dry NEUROLOGIC:  Alert and oriented x 3, non-focal PSYCHIATRIC:  Normal affect, good insight  ASSESSMENT:    1. Coronary artery disease involving native coronary artery of native heart without angina  pectoris   2. Palpitations   3. Mixed hyperlipidemia    PLAN:     Given his known disease I am going to place monitoring the patient to assess his PVC burden.  Once we are able to understand this he may need a cardiac MRI to understand if any infiltrative processes contributing to his LVH.  He had a recent echo at [facility] in December 2020 for now we will not repeat this.  As I do think that if further testing should be done a cardiac MRI will be the most appropriate test.  For his coronary disease have educated the patient what this means-he will continue his aspirin 81 mg daily and I will increase his Crestor to 20 mg daily.   The patient is in agreement with the above plan. The patient left the office in stable condition.  The patient will follow up in 1 month or sooner if needed.   Medication Adjustments/Labs and Tests Ordered: Current medicines are reviewed at length with the patient today.  Concerns regarding medicines are outlined above.  Orders Placed This Encounter  Procedures  . Basic Metabolic Panel (BMET)  . Magnesium  . TSH  . LONG TERM MONITOR (3-14 DAYS)   Meds ordered this encounter  Medications  . rosuvastatin (CRESTOR) 20 MG tablet    Sig: Take 1 tablet (20 mg total) by mouth daily.    Dispense:  90 tablet    Refill:  3    Patient Instructions  Medication Instructions:  Your physician has recommended you make the following change in your medication:   START: Crestor(rosuvastatin) 20 mg Take 1 tab daily  *If you need a refill on your cardiac medications before your next appointment, please call your pharmacy*  Lab Work: Your physician recommends that you return for lab work in: TODAY BMP,MAgnesium TSH  If you have labs (blood work) drawn today and your tests are completely normal, you will receive your results only by: Marland Kitchen MyChart Message (if you have MyChart) OR . A paper copy in the mail If you have any lab test that is abnormal or we need to  change your treatment, we will call you to review the results.  Testing/Procedures: A zio monitor was ordered today. It will remain on for 7 days. You will then return monitor and event diary in provided box. It takes 1-2 weeks for report to be downloaded and returned to [place]. We will call you with the results.  If monitor falls off or has orange flashing light, please call Zio for further instructions.     Follow-Up: At [facility], you and your health needs are our priority.  As part of our continuing mission to provide you with exceptional heart care, we have created designated Provider Care Teams.  These Care Teams include your primary Cardiologist (physician) and Advanced Practice Providers (APPs -  Physician Assistants and Nurse Practitioners) who all work together to provide you with the care you need, when you need it.  Your next appointment:   1 month(s)  The format for your next appointment:   In Person  Provider:   Berniece Salines, DO  Other Instructions      Adopting a Healthy Lifestyle.  Know what a healthy weight is for you (roughly BMI <25) and aim to maintain this   Aim for 7+ servings of fruits and vegetables daily   65-80+ fluid ounces of water or unsweet tea for healthy kidneys   Limit to max 1 drink of alcohol per day; avoid smoking/tobacco   Limit animal fats in diet for cholesterol and heart health - choose grass fed whenever available   Avoid highly processed foods, and foods high in saturated/trans fats   Aim for low stress - take time to unwind and care for your mental health   Aim for 150 min of moderate intensity exercise weekly for heart health, and weights twice weekly for bone health   Aim for 7-9 hours of sleep daily   When it comes to diets, agreement about the perfect plan isnt easy to find, even among the experts. Experts at the [place] developed an idea known as the Healthy Eating Plate. Just imagine a plate divided  into logical, healthy portions.   The emphasis is on diet quality:   Load up on vegetables and fruits - one-half of your plate: Aim for color and variety, and remember that potatoes dont count.   Go for whole grains - one-quarter of your plate: Whole wheat, barley, wheat berries, quinoa, oats, brown rice, and foods made with them. If you want pasta, go with whole wheat pasta.   Protein power - one-quarter of your plate: Fish, chicken, beans, and nuts are all healthy, versatile protein sources. Limit red meat.   The diet, however, does go beyond the plate, offering a few other suggestions.   Use healthy plant oils, such as olive, canola, soy, corn, sunflower and peanut. Check the labels, and avoid partially hydrogenated oil, which have unhealthy trans fats.   If youre thirsty, drink water. Coffee and tea are good in moderation, but skip sugary drinks and limit milk and dairy products to one or two daily servings.   The type of carbohydrate in the diet is more important than the amount. Some sources of carbohydrates, such as vegetables, fruits, whole grains, and beans-are healthier than others.   Finally, stay active  Signed, Luis Salines, DO  11/22/2019 2:45 PM    [facility]

## 2019-11-22 NOTE — Patient Instructions (Signed)
Medication Instructions:  Your physician has recommended you make the following change in your medication:   START: Crestor(rosuvastatin) 20 mg Take 1 tab daily  *If you need a refill on your cardiac medications before your next appointment, please call your pharmacy*  Lab Work: Your physician recommends that you return for lab work in: TODAY BMP,MAgnesium TSH  If you have labs (blood work) drawn today and your tests are completely normal, you will receive your results only by: Marland Kitchen MyChart Message (if you have MyChart) OR . A paper copy in the mail If you have any lab test that is abnormal or we need to change your treatment, we will call you to review the results.  Testing/Procedures: A zio monitor was ordered today. It will remain on for 7 days. You will then return monitor and event diary in provided box. It takes 1-2 weeks for report to be downloaded and returned to [place]. We will call you with the results. If monitor falls off or has orange flashing light, please call Zio for further instructions.     Follow-Up: At [facility], you and your health needs are our priority.  As part of our continuing mission to provide you with exceptional heart care, we have created designated Provider Care Teams.  These Care Teams include your primary Cardiologist (physician) and Advanced Practice Providers (APPs -  Physician Assistants and Nurse Practitioners) who all work together to provide you with the care you need, when you need it.  Your next appointment:   1 month(s)  The format for your next appointment:   In Person  Provider:   Berniece Salines, DO  Other Instructions

## 2019-11-23 ENCOUNTER — Telehealth: Payer: Self-pay | Admitting: *Deleted

## 2019-11-23 LAB — BASIC METABOLIC PANEL
BUN/Creatinine Ratio: 15 (ref 10–24)
BUN: 19 mg/dL (ref 8–27)
CO2: 22 mmol/L (ref 20–29)
Calcium: 9.5 mg/dL (ref 8.6–10.2)
Chloride: 104 mmol/L (ref 96–106)
Creatinine, Ser: 1.29 mg/dL — ABNORMAL HIGH (ref 0.76–1.27)
GFR calc Af Amer: 63 mL/min/{1.73_m2} (ref >59)
GFR calc non Af Amer: 55 mL/min/{1.73_m2} — ABNORMAL LOW (ref >59)
Glucose: 85 mg/dL (ref 65–99)
Potassium: 4.3 mmol/L (ref 3.5–5.2)
Sodium: 143 mmol/L (ref 134–144)

## 2019-11-23 LAB — TSH: TSH: 1.84 u[IU]/mL (ref 0.450–4.500)

## 2019-11-23 LAB — MAGNESIUM: Magnesium: 2 mg/dL (ref 1.6–2.3)

## 2019-11-23 NOTE — Telephone Encounter (Signed)
Telephone call to patient. Left message with lab results and need to hydrate more. Pt to call with any questions.

## 2019-11-23 NOTE — Telephone Encounter (Signed)
-----   Message from Berniece Salines, DO sent at 11/23/2019  8:11 AM EST ----- Cr slightly elevated. Please hydrate a little more.

## 2019-12-08 DIAGNOSIS — R002 Palpitations: Secondary | ICD-10-CM | POA: Diagnosis not present

## 2019-12-20 ENCOUNTER — Ambulatory Visit (INDEPENDENT_AMBULATORY_CARE_PROVIDER_SITE_OTHER): Payer: Medicare Other | Admitting: Cardiology

## 2019-12-20 ENCOUNTER — Encounter: Payer: Self-pay | Admitting: Cardiology

## 2019-12-20 ENCOUNTER — Other Ambulatory Visit: Payer: Self-pay

## 2019-12-20 VITALS — BP 140/80 | HR 59 | Ht 71.0 in | Wt 211.0 lb

## 2019-12-20 DIAGNOSIS — I472 Ventricular tachycardia: Secondary | ICD-10-CM | POA: Diagnosis not present

## 2019-12-20 DIAGNOSIS — I4729 Other ventricular tachycardia: Secondary | ICD-10-CM

## 2019-12-20 DIAGNOSIS — I471 Supraventricular tachycardia: Secondary | ICD-10-CM

## 2019-12-20 DIAGNOSIS — I493 Ventricular premature depolarization: Secondary | ICD-10-CM

## 2019-12-20 DIAGNOSIS — I2510 Atherosclerotic heart disease of native coronary artery without angina pectoris: Secondary | ICD-10-CM | POA: Diagnosis not present

## 2019-12-20 DIAGNOSIS — I10 Essential (primary) hypertension: Secondary | ICD-10-CM

## 2019-12-20 DIAGNOSIS — I4719 Other supraventricular tachycardia: Secondary | ICD-10-CM | POA: Insufficient documentation

## 2019-12-20 DIAGNOSIS — E782 Mixed hyperlipidemia: Secondary | ICD-10-CM

## 2019-12-20 MED ORDER — AMIODARONE HCL 200 MG PO TABS: 200.0000 mg | ORAL_TABLET | Freq: Every day | ORAL | 3 refills | Status: DC

## 2019-12-20 NOTE — Patient Instructions (Addendum)
Medication Instructions:  Your physician recommends that you continue on your current medications as directed. Please refer to the Current Medication list given to you today.  *If you need a refill on your cardiac medications before your next appointment, please call your pharmacy*   Lab Work: Basic metabolic panel 1 week prior to MRI   If you have labs (blood work) drawn today and your tests are completely normal, you will receive your results only by: Marland Kitchen MyChart Message (if you have MyChart) OR . A paper copy in the mail If you have any lab test that is abnormal or we need to change your treatment, we will call you to review the results.   Testing/Procedures: Your physician has ordered for you to have a cardiac MRI    You have been referred to see Cristopher Peru, MD for your NVST (Non-Sustained Ventricular Tachycardia) and Frequent PVC's   Follow-Up: At [facility], you and your health needs are our priority.  As part of our continuing mission to provide you with exceptional heart care, we have created designated Provider Care Teams.  These Care Teams include your primary Cardiologist (physician) and Advanced Practice Providers (APPs -  Physician Assistants and Nurse Practitioners) who all work together to provide you with the care you need, when you need it.  Your next appointment:   3 month(s)  The format for your next appointment:   In Person  Provider:   Berniece Salines, DO   Other Instructions We recommend signing up for the patient portal called "MyChart".  Sign up information is provided on this After Visit Summary.  MyChart is used to connect with patients for Virtual Visits (Telemedicine).  Patients are able to view lab/test results, encounter notes, upcoming appointments, etc.  Non-urgent messages can be sent to your provider as well.   To learn more about what you can do with MyChart, go to [email or web address].

## 2019-12-20 NOTE — Progress Notes (Signed)
Cardiology Office Note:    Date:  12/20/2019   ID:  Katy Fitch, DOB May 20, 1946, MRN [id number]  PCP:  Townsend Roger, MD  Cardiologist:  Berniece Salines, DO  Electrophysiologist:  None   Referring MD: Townsend Roger, MD   Chief Complaint  Patient presents with  . Follow-up    History of Present Illness:    Taher Wilch is a 74 y.o. male with a hx of recently diagnosed coronary artery disease, hypertension, hyperlipidemia, former smoker and recent Covid-19 infection who initially presented on October 20, 2019 to establish cardiac care.    Prior to his initial visit he was hospitalized at the [facility] for COVID-19.  He was treated and discharged home.  In addition he did undergo a pharmacologic stress test on September 26, 2019 which show evidence of moderate inferolateral wall ischemia.  And frequent PVCs noted with a EF of 43%.  At his visit giving his report of chest pain he was recommended to undergo cardiac catheterization.  The patient did undergo cardiac catheterization which showed proximal to mid 20% LAD stenosis.  His EF was 45 to 50% his LV was described to be somewhat dilated with thickened myocardium.  I saw the patient on feb 15,2021 and at that time he was status post LHC, I placed a zio monitor on the patient on understand his PVC burden. He is her today to discuss his monitor results. He tells me that he does experience some palpitations and at time he feels dizzy. No other complaints at this time.     Past Medical History:  Diagnosis Date  . Chest pain 10/20/2019  . Corn of toe 05/13/2016  . COVID-19 10/06/2019  . Dyslipidemia 10/20/2019  . Dyspnea on exertion 10/20/2019  . Essential hypertension 12/20/2014  . 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 Tinea pedis of both feet 05/13/2016    Past Surgical History:  Procedure Laterality Date  . CATARACT EXTRACTION Bilateral   . HERNIA REPAIR    . LEFT HEART CATH AND CORONARY ANGIOGRAPHY N/A 10/28/2019    Procedure: LEFT HEART CATH AND CORONARY ANGIOGRAPHY;  Surgeon: Leonie Man, MD;  Location: [place] [facility];  Service: Cardiovascular;  Laterality: N/A;  . REPLACEMENT TOTAL KNEE Bilateral   . TONSILLECTOMY      Current Medications: Current Meds  Medication Sig  . acetaminophen (TYLENOL) 500 MG tablet Take 1,000 mg by mouth every 6 (six) hours as needed for moderate pain or headache.  . albuterol (VENTOLIN HFA) 108 (90 Base) MCG/ACT inhaler Inhale 1-2 puffs into the lungs every 6 (six) hours as needed for wheezing or shortness of breath.   Marland Kitchen aspirin 81 MG EC tablet Take 81 mg by mouth daily.   Marland Kitchen lisinopril (ZESTRIL) 5 MG tablet Take 5 mg by mouth daily.  . Multiple Vitamin (MULTIVITAMIN WITH MINERALS) TABS tablet Take 1 tablet by mouth daily.  . Naphazoline-Glycerin (REDNESS RELIEF OP) Place 1 drop into both eyes daily as needed (redness/ irritation).  . nitroGLYCERIN (NITROSTAT) 0.4 MG SL tablet Place 1 tablet (0.4 mg total) under the tongue every 5 (five) minutes as needed.  . Omega-3 Fatty Acids (FISH OIL) 1000 MG CAPS Take 1,000 mg by mouth daily.  Marland Kitchen omeprazole (PRILOSEC) 20 MG capsule Take 20 mg by mouth daily.  . rosuvastatin (CRESTOR) 20 MG tablet Take 1 tablet (20 mg total) by mouth daily.     Allergies:   Patient has no known allergies.   Social History  Socioeconomic History  . Marital status: Divorced    Spouse name: Not on file  . Number of children: Not on file  . Years of education: Not on file  . Highest education level: Not on file  Occupational History  . Not on file  Tobacco Use  . Smoking status: Former Smoker    Types: Cigarettes    Quit date: 2020    Years since quitting: 1.2  . Smokeless tobacco: Never Used  Substance and Sexual Activity  . Alcohol use: Yes    Alcohol/week: 2.0 standard drinks    Types: 2 Shots of liquor per week  . Drug use: Yes    Types: Marijuana  . Sexual activity: Not on file  Other Topics Concern  . Not on file   Social History Narrative  . Not on file   Social Determinants of Health   Financial Resource Strain:   . Difficulty of Paying Living Expenses:   Food Insecurity:   . Worried About Charity fundraiser in the Last Year:   . Arboriculturist in the Last Year:   Transportation Needs:   . Film/video editor (Medical):   Marland Kitchen Lack of Transportation (Non-Medical):   Physical Activity:   . Days of Exercise per Week:   . Minutes of Exercise per Session:   Stress:   . Feeling of Stress :   Social Connections:   . Frequency of Communication with Friends and Family:   . Frequency of Social Gatherings with Friends and Family:   . Attends Religious Services:   . Active Member of Clubs or Organizations:   . Attends Archivist Meetings:   Marland Kitchen Marital Status:      Family History: The patient's family history includes Seizures in his mother.  ROS:   Review of Systems  Constitution: Negative for decreased appetite, fever and weight gain.  HENT: Negative for congestion, ear discharge, hoarse voice and sore throat.   Eyes: Negative for discharge, redness, vision loss in right eye and visual halos.  Cardiovascular: Negative for chest pain, dyspnea on exertion, leg swelling, orthopnea and palpitations.  Respiratory: Negative for cough, hemoptysis, shortness of breath and snoring.   Endocrine: Negative for heat intolerance and polyphagia.  Hematologic/Lymphatic: Negative for bleeding problem. Does not bruise/bleed easily.  Skin: Negative for flushing, nail changes, rash and suspicious lesions.  Musculoskeletal: Negative for arthritis, joint pain, muscle cramps, myalgias, neck pain and stiffness.  Gastrointestinal: Negative for abdominal pain, bowel incontinence, diarrhea and excessive appetite.  Genitourinary: Negative for decreased libido, genital sores and incomplete emptying.  Neurological: Negative for brief paralysis, focal weakness, headaches and loss of balance.   Psychiatric/Behavioral: Negative for altered mental status, depression and suicidal ideas.  Allergic/Immunologic: Negative for HIV exposure and persistent infections.    EKGs/Labs/Other Studies Reviewed:    The following studies were reviewed today:   EKG:  None today  Pharmacologic nuclear stress test with fixed defect of moderate severity at the inferolateral wall at the apex.  Ejection fraction 43%.  Of note significant fusion PVC during the stress portion of the test.  Transthoracic echocardiogram 10/06/2019 Low normal left ventricular ejection fraction 50 to 55%.  Paradoxical motion consistent with left bundle branch block.  Left atrium normal size, right atrium normal size.  Mild mitral regurgitation.  Trace tricuspid regurgitation.  Aortic root and aortic arch well visualized.   Zio Monitor The patient wore the monitor for 7 days 3 hours starting 11/22/2019. Indication: Palpitations The minimum  heart rate was 37 bpm, maximum heart rate was 193 bpm, and average heart rate was 60 bpm. Predominant underlying rhythm was Sinus Rhythm. Bundle Branch Block/IVCD was present.   487 Ventricular Tachycardia runs occurred, the run with the fastest interval lasting 4 beats with a maximum rate of 193 bpm, the longest lasting 16 beats with an average rate of 105 bpm.  321 Supraventricular Tachycardia runs occurred, the run with the fastest interval lasting 7 beats with a maximum rate of 171 bpm, the longest lasting 17.8 Seconds with an average rate of 108 bpm.  Premature atrial complexes were rare (<1.0%). Premature Ventricular complexes were frequent (8.1%, 50267), VE Couplets were occasional (2.5%, 7654), and VE Triplets were rare (<1.0%, 292). Ventricular Bigeminy and Trigeminy were present. No pauses, No AV block and no atrial fibrillation present.  2 patient triggered events into diary event noted all associated with ventricular ectopy  Conclusion: This study is remarkable for  the following:                             1.  Nonsustained ventricular tachycardia (487 runs occurred).                             2.  Paroxysmal supraventricular tachycardia which is likely atrial tachycardia with variable block  (321 runs occurred).                             3.  Frequent premature ventricular complexes.    LHC 10/28/2019  There is mild left ventricular systolic dysfunction. The left ventricular ejection fraction is 45-50% by visual estimate.  LV end diastolic pressure is mildly elevated. In the LV appears to be somewhat dilated with thickened myocardium.  1st Mrg lesion is 55% stenosed.  Prox LAD to Mid LAD lesion is 20% stenosed.   SUMMARY  Angiographically moderate single-vessel disease with a moderate caliber OM1 having 50% stenosis, otherwise angiographically normal coronary arteries.  Globular dilated left ventricle but with actually relatively preserved EF.  Difficult to really assess true wall motion normality.  Recommend 2D echo and consider cardiac MRI given the suspicion of significant LVH.Marland Kitchen  RECOMMENDATIONS  Discharge home after bedrest  Follow-up with Dr. Harriet Masson for additional cardiac evaluation.  Consider additional imaging evaluation of the heart --including 2D echocardiogram and consideration of possible MRI  Recent Labs: 10/20/2019: Hemoglobin 12.3; Platelets 434 11/22/2019: BUN 19; Creatinine, Ser 1.29; Magnesium 2.0; Potassium 4.3; Sodium 143; TSH 1.840  Recent Lipid Panel No results found for: CHOL, TRIG, HDL, CHOLHDL, VLDL, LDLCALC, LDLDIRECT  Physical Exam:    VS:  BP 140/80 (BP Location: Left Arm, Patient Position: Sitting, Cuff Size: Normal)   Pulse (!) 59   Ht 5\' 11"  (1.803 m)   Wt 211 lb (95.7 kg)   SpO2 97%   BMI 29.43 kg/m     Wt Readings from Last 3 Encounters:  12/20/19 211 lb (95.7 kg)  11/22/19 207 lb (93.9 kg)  10/28/19 203 lb (92.1 kg)     GEN: Well nourished, well developed in no acute distress HEENT:  Normal NECK: No JVD; No carotid bruits LYMPHATICS: No lymphadenopathy CARDIAC: S1S2 noted,RRR, no murmurs, rubs, gallops RESPIRATORY:  Clear to auscultation without rales, wheezing or rhonchi  ABDOMEN: Soft, non-tender, non-distended, +bowel sounds, no guarding. EXTREMITIES: No edema, No cyanosis, no clubbing MUSCULOSKELETAL:  No  deformity  SKIN: Warm and dry NEUROLOGIC:  Alert and oriented x 3, non-focal PSYCHIATRIC:  Normal affect, good insight  ASSESSMENT:    1. NSVT (nonsustained ventricular tachycardia) ([place])   2. Frequent PVCs   3. Coronary artery disease involving native coronary artery of native heart without angina pectoris   4. PAT (paroxysmal atrial tachycardia) ([place])   5. Essential hypertension   6. Mixed hyperlipidemia    PLAN:    His Zio monitor showed significant arrhythmia burden ( NSVT, PVC). I will start the patient on Amiodarone 200 mg twice daily for 14 days and then Amiodarone 200 mg  daily to help with his ventricular arrhythmia.   I have discussed the benefit of cardiac MRI with the patient. He is agreeable. I will order a cardia MRI to assess for infiltrative disease and/or myocardial scarring.  EP referral is also in place.   CAD - continue Aspirin 81 mg daily and Crestor 20 mg daily.  HTN - continue with current medication regimen.   Hyperlipidemia - continue patient on Crestor 20 mg daily.  The patient is in agreement with the above plan. The patient left the office in stable condition.  The patient will follow up in 3 months or sooner.   Medication Adjustments/Labs and Tests Ordered: Current medicines are reviewed at length with the patient today.  Concerns regarding medicines are outlined above.  Orders Placed This Encounter  Procedures  . MR Card Morphology Wo/W Cm  . Basic metabolic panel  . Ambulatory referral to Cardiac Electrophysiology   Meds ordered this encounter  Medications  . DISCONTD: amiodarone (PACERONE) 200 MG tablet    Sig:  Take 1 tablet (200 mg total) by mouth daily.    Dispense:  30 tablet    Refill:  3  . amiodarone (PACERONE) 200 MG tablet    Sig: Take 1 tablet (200 mg total) by mouth daily.    Dispense:  30 tablet    Refill:  3    Patient Instructions  Medication Instructions:  Your physician recommends that you continue on your current medications as directed. Please refer to the Current Medication list given to you today.  *If you need a refill on your cardiac medications before your next appointment, please call your pharmacy*   Lab Work: Basic metabolic panel 1 week prior to MRI   If you have labs (blood work) drawn today and your tests are completely normal, you will receive your results only by: Marland Kitchen MyChart Message (if you have MyChart) OR . A paper copy in the mail If you have any lab test that is abnormal or we need to change your treatment, we will call you to review the results.   Testing/Procedures: Your physician has ordered for you to have a cardiac MRI    You have been referred to see Cristopher Peru, MD for your NVST (Non-Sustained Ventricular Tachycardia) and Frequent PVC's   Follow-Up: At [facility], you and your health needs are our priority.  As part of our continuing mission to provide you with exceptional heart care, we have created designated Provider Care Teams.  These Care Teams include your primary Cardiologist (physician) and Advanced Practice Providers (APPs -  Physician Assistants and Nurse Practitioners) who all work together to provide you with the care you need, when you need it.  Your next appointment:   3 month(s)  The format for your next appointment:   In Person  Provider:   Berniece Salines, DO   Other Instructions  We recommend signing up for the patient portal called "MyChart".  Sign up information is provided on this After Visit Summary.  MyChart is used to connect with patients for Virtual Visits (Telemedicine).  Patients are able to view lab/test  results, encounter notes, upcoming appointments, etc.  Non-urgent messages can be sent to your provider as well.   To learn more about what you can do with MyChart, go to [email or web address].       Adopting a Healthy Lifestyle.  Know what a healthy weight is for you (roughly BMI <25) and aim to maintain this   Aim for 7+ servings of fruits and vegetables daily   65-80+ fluid ounces of water or unsweet tea for healthy kidneys   Limit to max 1 drink of alcohol per day; avoid smoking/tobacco   Limit animal fats in diet for cholesterol and heart health - choose grass fed whenever available   Avoid highly processed foods, and foods high in saturated/trans fats   Aim for low stress - take time to unwind and care for your mental health   Aim for 150 min of moderate intensity exercise weekly for heart health, and weights twice weekly for bone health   Aim for 7-9 hours of sleep daily   When it comes to diets, agreement about the perfect plan isnt easy to find, even among the experts. Experts at the [employer] developed an idea known as the Healthy Eating Plate. Just imagine a plate divided into logical, healthy portions.   The emphasis is on diet quality:   Load up on vegetables and fruits - one-half of your plate: Aim for color and variety, and remember that potatoes dont count.   Go for whole grains - one-quarter of your plate: Whole wheat, barley, wheat berries, quinoa, oats, brown rice, and foods made with them. If you want pasta, go with whole wheat pasta.   Protein power - one-quarter of your plate: Fish, chicken, beans, and nuts are all healthy, versatile protein sources. Limit red meat.   The diet, however, does go beyond the plate, offering a few other suggestions.   Use healthy plant oils, such as olive, canola, soy, corn, sunflower and peanut. Check the labels, and avoid partially hydrogenated oil, which have unhealthy trans fats.   If youre  thirsty, drink water. Coffee and tea are good in moderation, but skip sugary drinks and limit milk and dairy products to one or two daily servings.   The type of carbohydrate in the diet is more important than the amount. Some sources of carbohydrates, such as vegetables, fruits, whole grains, and beans-are healthier than others.   Finally, stay active  Signed, Berniece Salines, DO  12/20/2019 8:57 PM    [facility]

## 2019-12-24 DIAGNOSIS — I25118 Atherosclerotic heart disease of native coronary artery with other forms of angina pectoris: Secondary | ICD-10-CM | POA: Diagnosis not present

## 2019-12-24 DIAGNOSIS — I10 Essential (primary) hypertension: Secondary | ICD-10-CM | POA: Diagnosis not present

## 2020-01-18 DIAGNOSIS — I472 Ventricular tachycardia: Secondary | ICD-10-CM | POA: Diagnosis not present

## 2020-01-19 LAB — BASIC METABOLIC PANEL
BUN/Creatinine Ratio: 12 (ref 10–24)
BUN: 14 mg/dL (ref 8–27)
CO2: 22 mmol/L (ref 20–29)
Calcium: 9.3 mg/dL (ref 8.6–10.2)
Chloride: 106 mmol/L (ref 96–106)
Creatinine, Ser: 1.19 mg/dL (ref 0.76–1.27)
GFR calc Af Amer: 70 mL/min/{1.73_m2} (ref >59)
GFR calc non Af Amer: 60 mL/min/{1.73_m2} (ref >59)
Glucose: 106 mg/dL — ABNORMAL HIGH (ref 65–99)
Potassium: 3.8 mmol/L (ref 3.5–5.2)
Sodium: 144 mmol/L (ref 134–144)

## 2020-01-21 ENCOUNTER — Ambulatory Visit (INDEPENDENT_AMBULATORY_CARE_PROVIDER_SITE_OTHER): Payer: Medicare Other | Admitting: Internal Medicine

## 2020-01-21 ENCOUNTER — Encounter: Payer: Self-pay | Admitting: Internal Medicine

## 2020-01-21 ENCOUNTER — Other Ambulatory Visit: Payer: Self-pay

## 2020-01-21 VITALS — BP 130/74 | HR 68 | Ht 71.0 in | Wt 210.2 lb

## 2020-01-21 DIAGNOSIS — I493 Ventricular premature depolarization: Secondary | ICD-10-CM

## 2020-01-21 DIAGNOSIS — I472 Ventricular tachycardia: Secondary | ICD-10-CM

## 2020-01-21 DIAGNOSIS — I4729 Other ventricular tachycardia: Secondary | ICD-10-CM

## 2020-01-21 MED ORDER — AMIODARONE HCL 200 MG PO TABS: 200.0000 mg | ORAL_TABLET | Freq: Every day | ORAL | 3 refills | Status: DC

## 2020-01-21 NOTE — Patient Instructions (Signed)
Medication Instructions:  CONTINUE AMIODARONE EVERY DAY UNTIL MID-JUNE AROUND THE 15TH THEN TAKE EVERYDAY EXCEPT NOT ON SUNDAYS  *If you need a refill on your cardiac medications before your next appointment, please call your pharmacy*   Lab Work:  If you have labs (blood work) drawn today and your tests are completely normal, you will receive your results only by: Marland Kitchen MyChart Message (if you have MyChart) OR . A paper copy in the mail If you have any lab test that is abnormal or we need to change your treatment, we will call you to review the results.   Testing/Procedures:    Follow-Up: At [facility], you and your health needs are our priority.  As part of our continuing mission to provide you with exceptional heart care, we have created designated Provider Care Teams.  These Care Teams include your primary Cardiologist (physician) and Advanced Practice Providers (APPs -  Physician Assistants and Nurse Practitioners) who all work together to provide you with the care you need, when you need it.  We recommend signing up for the patient portal called "MyChart".  Sign up information is provided on this After Visit Summary.  MyChart is used to connect with patients for Virtual Visits (Telemedicine).  Patients are able to view lab/test results, encounter notes, upcoming appointments, etc.  Non-urgent messages can be sent to your provider as well.   To learn more about what you can do with MyChart, go to [email or web address].    Your next appointment:   3 month(s)  The format for your next appointment:   In Person  Provider:   DR. Lovena Le    Other Instructions

## 2020-01-21 NOTE — Progress Notes (Signed)
HPI Luis Hood is referred today by Dr. Harriet Masson for evaluation of PVC's and NSVT. He has a h/o Covid infection in January and underwent left heart cath which demonstrated mild LV dysfunction and non-obstructive CAD. He he was treated for sob. He has atypical chest pain. He wore a cardiac monitor demonstrating NSVT and frequent PVC's. He was started on amiodarone 200 mg daily. He has not had syncope. His cough has improved.   No Known Allergies   Current Outpatient Medications  Medication Sig Dispense Refill  . acetaminophen (TYLENOL) 500 MG tablet Take 1,000 mg by mouth every 6 (six) hours as needed for moderate pain or headache.    . albuterol (VENTOLIN HFA) 108 (90 Base) MCG/ACT inhaler Inhale 1-2 puffs into the lungs every 6 (six) hours as needed for wheezing or shortness of breath.     Marland Kitchen amiodarone (PACERONE) 200 MG tablet Take 1 tablet (200 mg total) by mouth daily. 30 tablet 3  . aspirin 81 MG EC tablet Take 81 mg by mouth daily.     Marland Kitchen lisinopril (ZESTRIL) 5 MG tablet Take 5 mg by mouth daily.    . Multiple Vitamin (MULTIVITAMIN WITH MINERALS) TABS tablet Take 1 tablet by mouth daily.    . Naphazoline-Glycerin (REDNESS RELIEF OP) Place 1 drop into both eyes daily as needed (redness/ irritation).    . Omega-3 Fatty Acids (FISH OIL) 1000 MG CAPS Take 1,000 mg by mouth daily.    Marland Kitchen omeprazole (PRILOSEC) 20 MG capsule Take 20 mg by mouth daily.    . rosuvastatin (CRESTOR) 20 MG tablet Take 1 tablet (20 mg total) by mouth daily. 90 tablet 3  . amLODipine (NORVASC) 10 MG tablet Take 10 mg by mouth daily.    . nitroGLYCERIN (NITROSTAT) 0.4 MG SL tablet Place 1 tablet (0.4 mg total) under the tongue every 5 (five) minutes as needed. 30 tablet 3   No current facility-administered medications for this visit.     Past Medical History:  Diagnosis Date  . Chest pain 10/20/2019  . Corn of toe 05/13/2016  . COVID-19 10/06/2019  . Dyslipidemia 10/20/2019  . Dyspnea on exertion 10/20/2019  .  Essential hypertension 12/20/2014  . 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 Tinea pedis of both feet 05/13/2016    ROS:   All systems reviewed and negative except as noted in the HPI.   Past Surgical History:  Procedure Laterality Date  . CATARACT EXTRACTION Bilateral   . HERNIA REPAIR    . LEFT HEART CATH AND CORONARY ANGIOGRAPHY N/A 10/28/2019   Procedure: LEFT HEART CATH AND CORONARY ANGIOGRAPHY;  Surgeon: Leonie Man, MD;  Location: [place] [facility];  Service: Cardiovascular;  Laterality: N/A;  . REPLACEMENT TOTAL KNEE Bilateral   . TONSILLECTOMY       Family History  Problem Relation Age of Onset  . Seizures Mother      Social History   Socioeconomic History  . Marital status: Divorced    Spouse name: Not on file  . Number of children: Not on file  . Years of education: Not on file  . Highest education level: Not on file  Occupational History  . Not on file  Tobacco Use  . Smoking status: Former Smoker    Types: Cigarettes    Quit date: 2020    Years since quitting: 1.2  . Smokeless tobacco: Never Used  Substance and Sexual Activity  . Alcohol use: Yes    Alcohol/week:  2.0 standard drinks    Types: 2 Shots of liquor per week  . Drug use: Yes    Types: Marijuana  . Sexual activity: Not on file  Other Topics Concern  . Not on file  Social History Narrative  . Not on file   Social Determinants of Health   Financial Resource Strain:   . Difficulty of Paying Living Expenses:   Food Insecurity:   . Worried About Charity fundraiser in the Last Year:   . Arboriculturist in the Last Year:   Transportation Needs:   . Film/video editor (Medical):   Marland Kitchen Lack of Transportation (Non-Medical):   Physical Activity:   . Days of Exercise per Week:   . Minutes of Exercise per Session:   Stress:   . Feeling of Stress :   Social Connections:   . Frequency of Communication with Friends and Family:   . Frequency of Social Gatherings with Friends and Family:    . Attends Religious Services:   . Active Member of Clubs or Organizations:   . Attends Archivist Meetings:   Marland Kitchen Marital Status:   Intimate Partner Violence:   . Fear of Current or Ex-Partner:   . Emotionally Abused:   Marland Kitchen Physically Abused:   . Sexually Abused:      BP 130/74   Pulse 68   Ht 5\' 11"  (1.803 m)   Wt 210 lb 3.2 oz (95.3 kg)   SpO2 97%   BMI 29.32 kg/m   Physical Exam:  Well appearing NAD HEENT: Unremarkable Neck:  No JVD, no thyromegally Lymphatics:  No adenopathy Back:  No CVA tenderness Lungs:  Clear with no wheezes except for left upper lung field HEART:  Regular rate rhythm, no murmurs, no rubs, no clicks Abd:  soft, positive bowel sounds, no organomegally, no rebound, no guarding Ext:  2 plus pulses, no edema, no cyanosis, no clubbing Skin:  No rashes no nodules Neuro:  CN II through XII intact, motor grossly intact  EKG - nsr with RBBB and PVC's in couplets.  Assess/Plan: 1. Ventricular ectopy - it is hard for me to assess his symptoms but they do not appear to be too severe. I discussed the treatment options and recommended we continue amiodarone 200 mg daily for 2 more months and then reduce to 200 mg daily, except none on Sunday. I will see him back in 3 months when we hope to further reduce his amiodarone.  2. RBBB - he has some conduction system disease and we will need to watch his conduction.  3. Dyspnea - his symptoms seem to be improved over the last few weeks. I suspect that he has some residual covid infection.  [email or web address] [email or web address].D.

## 2020-03-17 ENCOUNTER — Ambulatory Visit: Payer: Medicare Other | Admitting: Cardiology

## 2020-03-29 DIAGNOSIS — Z0000 Encounter for general adult medical examination without abnormal findings: Secondary | ICD-10-CM | POA: Diagnosis not present

## 2020-03-29 DIAGNOSIS — Z1389 Encounter for screening for other disorder: Secondary | ICD-10-CM | POA: Diagnosis not present

## 2020-03-29 DIAGNOSIS — N182 Chronic kidney disease, stage 2 (mild): Secondary | ICD-10-CM | POA: Diagnosis not present

## 2020-03-29 DIAGNOSIS — E785 Hyperlipidemia, unspecified: Secondary | ICD-10-CM | POA: Diagnosis not present

## 2020-03-29 DIAGNOSIS — I10 Essential (primary) hypertension: Secondary | ICD-10-CM | POA: Diagnosis not present

## 2020-03-29 DIAGNOSIS — R3911 Hesitancy of micturition: Secondary | ICD-10-CM | POA: Diagnosis not present

## 2020-03-29 DIAGNOSIS — I2510 Atherosclerotic heart disease of native coronary artery without angina pectoris: Secondary | ICD-10-CM | POA: Diagnosis not present

## 2020-04-06 ENCOUNTER — Encounter: Payer: Self-pay | Admitting: Cardiology

## 2020-04-06 ENCOUNTER — Ambulatory Visit (INDEPENDENT_AMBULATORY_CARE_PROVIDER_SITE_OTHER): Payer: Medicare Other | Admitting: Cardiology

## 2020-04-06 ENCOUNTER — Other Ambulatory Visit: Payer: Self-pay

## 2020-04-06 VITALS — BP 120/68 | HR 47 | Ht 71.0 in | Wt 213.0 lb

## 2020-04-06 DIAGNOSIS — I10 Essential (primary) hypertension: Secondary | ICD-10-CM | POA: Diagnosis not present

## 2020-04-06 DIAGNOSIS — I2510 Atherosclerotic heart disease of native coronary artery without angina pectoris: Secondary | ICD-10-CM

## 2020-04-06 DIAGNOSIS — I472 Ventricular tachycardia: Secondary | ICD-10-CM | POA: Diagnosis not present

## 2020-04-06 DIAGNOSIS — I4729 Other ventricular tachycardia: Secondary | ICD-10-CM

## 2020-04-06 DIAGNOSIS — I493 Ventricular premature depolarization: Secondary | ICD-10-CM

## 2020-04-06 DIAGNOSIS — E785 Hyperlipidemia, unspecified: Secondary | ICD-10-CM

## 2020-04-06 NOTE — Progress Notes (Signed)
Cardiology Office Note:    Date:  04/06/2020   ID:  Katy Fitch, DOB November 10, 1945, MRN [id number]  PCP:  Townsend Roger, MD  Cardiologist:  Berniece Salines, DO  Electrophysiologist:  None   Referring MD: Townsend Roger, MD   Chief Complaint  Patient presents with  . Follow-up  "I feel a bit lightheaded today.  History of Present Illness:    Luis Hood is a 73 y.o. male with a hx of coronary artery disease, hypertension, hyperlipidemia, former smoker and recent Covid-19 infection, frequent PVCs and nonsustained ventricular tachycardia.  Today he is here for follow-up visit he tells me that he had some leg swelling and his PCP stopped his amiodarone on March 29, 2020.  Today hehas some slight lightheadedness.    Past Medical History:  Diagnosis Date  . Chest pain 10/20/2019  . Corn of toe 05/13/2016  . COVID-19 10/06/2019  . Dyslipidemia 10/20/2019  . Dyspnea on exertion 10/20/2019  . Essential hypertension 12/20/2014  . 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 Tinea pedis of both feet 05/13/2016    Past Surgical History:  Procedure Laterality Date  . CATARACT EXTRACTION Bilateral   . HERNIA REPAIR    . LEFT HEART CATH AND CORONARY ANGIOGRAPHY N/A 10/28/2019   Procedure: LEFT HEART CATH AND CORONARY ANGIOGRAPHY;  Surgeon: Leonie Man, MD;  Location: [facility];  Service: Cardiovascular;  Laterality: N/A;  . REPLACEMENT TOTAL KNEE Bilateral   . TONSILLECTOMY      Current Medications: Current Meds  Medication Sig  . acetaminophen (TYLENOL) 500 MG tablet Take 1,000 mg by mouth every 6 (six) hours as needed for moderate pain or headache.  . albuterol (VENTOLIN HFA) 108 (90 Base) MCG/ACT inhaler Inhale 1-2 puffs into the lungs every 6 (six) hours as needed for wheezing or shortness of breath.   Marland Kitchen amiodarone (PACERONE) 200 MG tablet Take 1 tablet (200 mg total) by mouth daily. TAKE ONE TABLET DAILY UNTIL MID-JUNE (AROUND THE 15TH), THEN TAKE EVERYDAY EXCEPT NOT ON SUNDAYS  THEREAFTER  . amLODipine (NORVASC) 10 MG tablet Take 10 mg by mouth daily.  Marland Kitchen aspirin 81 MG EC tablet Take 81 mg by mouth daily.   Marland Kitchen lisinopril (ZESTRIL) 10 MG tablet Take 10 mg by mouth daily.  . Multiple Vitamin (MULTIVITAMIN WITH MINERALS) TABS tablet Take 1 tablet by mouth daily.  . Naphazoline-Glycerin (REDNESS RELIEF OP) Place 1 drop into both eyes daily as needed (redness/ irritation).  . Omega-3 Fatty Acids (FISH OIL) 1000 MG CAPS Take 1,000 mg by mouth daily.  Marland Kitchen omeprazole (PRILOSEC) 20 MG capsule Take 20 mg by mouth daily.     Allergies:   Patient has no known allergies.   Social History   Socioeconomic History  . Marital status: Divorced    Spouse name: Not on file  . Number of children: Not on file  . Years of education: Not on file  . Highest education level: Not on file  Occupational History  . Not on file  Tobacco Use  . Smoking status: Former Smoker    Types: Cigarettes    Quit date: 2020    Years since quitting: 1.4  . Smokeless tobacco: Never Used  Substance and Sexual Activity  . Alcohol use: Yes    Alcohol/week: 2.0 standard drinks    Types: 2 Shots of liquor per week  . Drug use: Yes    Types: Marijuana  . Sexual activity: Not on file  Other Topics Concern  .  Not on file  Social History Narrative  . Not on file   Social Determinants of Health   Financial Resource Strain:   . Difficulty of Paying Living Expenses:   Food Insecurity:   . Worried About Charity fundraiser in the Last Year:   . Arboriculturist in the Last Year:   Transportation Needs:   . Film/video editor (Medical):   Marland Kitchen Lack of Transportation (Non-Medical):   Physical Activity:   . Days of Exercise per Week:   . Minutes of Exercise per Session:   Stress:   . Feeling of Stress :   Social Connections:   . Frequency of Communication with Friends and Family:   . Frequency of Social Gatherings with Friends and Family:   . Attends Religious Services:   . Active Member of  Clubs or Organizations:   . Attends Archivist Meetings:   Marland Kitchen Marital Status:      Family History: The patient's family history includes Seizures in his mother.  ROS:   Review of Systems  Constitution: Negative for decreased appetite, fever and weight gain.  HENT: Negative for congestion, ear discharge, hoarse voice and sore throat.   Eyes: Negative for discharge, redness, vision loss in right eye and visual halos.  Cardiovascular: Negative for chest pain, dyspnea on exertion, leg swelling, orthopnea and palpitations.  Respiratory: Negative for cough, hemoptysis, shortness of breath and snoring.   Endocrine: Negative for heat intolerance and polyphagia.  Hematologic/Lymphatic: Negative for bleeding problem. Does not bruise/bleed easily.  Skin: Negative for flushing, nail changes, rash and suspicious lesions.  Musculoskeletal: Negative for arthritis, joint pain, muscle cramps, myalgias, neck pain and stiffness.  Gastrointestinal: Negative for abdominal pain, bowel incontinence, diarrhea and excessive appetite.  Genitourinary: Negative for decreased libido, genital sores and incomplete emptying.  Neurological: Negative for brief paralysis, focal weakness, headaches and loss of balance.  Psychiatric/Behavioral: Negative for altered mental status, depression and suicidal ideas.  Allergic/Immunologic: Negative for HIV exposure and persistent infections.    EKGs/Labs/Other Studies Reviewed:    The following studies were reviewed today:   EKG:  The ekg ordered today demonstrates sinus bradycardia, with sinus arrhythmia occasional PVCs and underlying rhythm.  Recent Labs: 10/20/2019: Hemoglobin 12.3; Platelets 434 11/22/2019: Magnesium 2.0; TSH 1.840 01/18/2020: BUN 14; Creatinine, Ser 1.19; Potassium 3.8; Sodium 144  Recent Lipid Panel No results found for: CHOL, TRIG, HDL, CHOLHDL, VLDL, LDLCALC, LDLDIRECT  Physical Exam:    VS:  BP 120/68   Pulse (!) 47   Ht 5\' 11"  (1.803  m)   Wt 213 lb (96.6 kg)   SpO2 92%   BMI 29.71 kg/m     Wt Readings from Last 3 Encounters:  04/06/20 213 lb (96.6 kg)  01/21/20 210 lb 3.2 oz (95.3 kg)  12/20/19 211 lb (95.7 kg)     GEN: Well nourished, well developed in no acute distress HEENT: Normal NECK: No JVD; No carotid bruits LYMPHATICS: No lymphadenopathy CARDIAC: S1S2 noted,RRR, no murmurs, rubs, gallops RESPIRATORY:  Clear to auscultation without rales, wheezing or rhonchi  ABDOMEN: Soft, non-tender, non-distended, +bowel sounds, no guarding. EXTREMITIES: No edema, No cyanosis, no clubbing MUSCULOSKELETAL:  No deformity  SKIN: Warm and dry NEUROLOGIC:  Alert and oriented x 3, non-focal PSYCHIATRIC:  Normal affect, good insight  ASSESSMENT:    1. Coronary artery disease involving native coronary artery of native heart without angina pectoris   2. Essential hypertension   3. NSVT (nonsustained ventricular tachycardia) ([place])  4. Frequent PVCs   5. Dyslipidemia    PLAN:     His Amiodarone was stopped by his pcp for leg swelling. In the light of his NSVT, frequent PVC with symptoms which was improved on his Amiodarone I think it will be beneficial to restart should he start experiencing symptoms - he see EP tomorrow as well so I will wait to also see what Dr. Lovena Le thinks.   Hypertension -blood pressure acceptable continue current regimen.  Coronary artery disease having no signs of angina continue patient on current aspirin 81 mg daily as well as his statin  Hyperlipidemia continue simvastatin 20 mg daily.  The patient is in agreement with the above plan. The patient left the office in stable condition.  The patient will follow up in   Medication Adjustments/Labs and Tests Ordered: Current medicines are reviewed at length with the patient today.  Concerns regarding medicines are outlined above.  Orders Placed This Encounter  Procedures  . EKG 12-Lead   No orders of the defined types were placed in this  encounter.   Patient Instructions  Medication Instructions:  Your physician recommends that you continue on your current medications as directed. Please refer to the Current Medication list given to you today.  *If you need a refill on your cardiac medications before your next appointment, please call your pharmacy*   Lab Work: None ordered   If you have labs (blood work) drawn today and your tests are completely normal, you will receive your results only by: Marland Kitchen MyChart Message (if you have MyChart) OR . A paper copy in the mail If you have any lab test that is abnormal or we need to change your treatment, we will call you to review the results.   Testing/Procedures: None ordered    Follow-Up: At [facility], you and your health needs are our priority.  As part of our continuing mission to provide you with exceptional heart care, we have created designated Provider Care Teams.  These Care Teams include your primary Cardiologist (physician) and Advanced Practice Providers (APPs -  Physician Assistants and Nurse Practitioners) who all work together to provide you with the care you need, when you need it.  We recommend signing up for the patient portal called "MyChart".  Sign up information is provided on this After Visit Summary.  MyChart is used to connect with patients for Virtual Visits (Telemedicine).  Patients are able to view lab/test results, encounter notes, upcoming appointments, etc.  Non-urgent messages can be sent to your provider as well.   To learn more about what you can do with MyChart, go to [email or web address].    Your next appointment:   6 month(s)  The format for your next appointment:   In Person  Provider:   Berniece Salines, DO   Other Instructions None      Adopting a Healthy Lifestyle.  Know what a healthy weight is for you (roughly BMI <25) and aim to maintain this   Aim for 7+ servings of fruits and vegetables daily   65-80+ fluid ounces of  water or unsweet tea for healthy kidneys   Limit to max 1 drink of alcohol per day; avoid smoking/tobacco   Limit animal fats in diet for cholesterol and heart health - choose grass fed whenever available   Avoid highly processed foods, and foods high in saturated/trans fats   Aim for low stress - take time to unwind and care for your mental health   Aim for  150 min of moderate intensity exercise weekly for heart health, and weights twice weekly for bone health   Aim for 7-9 hours of sleep daily   When it comes to diets, agreement about the perfect plan isnt easy to find, even among the experts. Experts at the [employer] developed an idea known as the Healthy Eating Plate. Just imagine a plate divided into logical, healthy portions.   The emphasis is on diet quality:   Load up on vegetables and fruits - one-half of your plate: Aim for color and variety, and remember that potatoes dont count.   Go for whole grains - one-quarter of your plate: Whole wheat, barley, wheat berries, quinoa, oats, brown rice, and foods made with them. If you want pasta, go with whole wheat pasta.   Protein power - one-quarter of your plate: Fish, chicken, beans, and nuts are all healthy, versatile protein sources. Limit red meat.   The diet, however, does go beyond the plate, offering a few other suggestions.   Use healthy plant oils, such as olive, canola, soy, corn, sunflower and peanut. Check the labels, and avoid partially hydrogenated oil, which have unhealthy trans fats.   If youre thirsty, drink water. Coffee and tea are good in moderation, but skip sugary drinks and limit milk and dairy products to one or two daily servings.   The type of carbohydrate in the diet is more important than the amount. Some sources of carbohydrates, such as vegetables, fruits, whole grains, and beans-are healthier than others.   Finally, stay active  Signed, Berniece Salines, DO  04/06/2020 4:44 PM     [facility]

## 2020-04-06 NOTE — Patient Instructions (Signed)
Medication Instructions:  Your physician recommends that you continue on your current medications as directed. Please refer to the Current Medication list given to you today.  *If you need a refill on your cardiac medications before your next appointment, please call your pharmacy*   Lab Work: None ordered   If you have labs (blood work) drawn today and your tests are completely normal, you will receive your results only by: . MyChart Message (if you have MyChart) OR . A paper copy in the mail If you have any lab test that is abnormal or we need to change your treatment, we will call you to review the results.   Testing/Procedures: None ordered    Follow-Up: At [facility], you and your health needs are our priority.  As part of our continuing mission to provide you with exceptional heart care, we have created designated Provider Care Teams.  These Care Teams include your primary Cardiologist (physician) and Advanced Practice Providers (APPs -  Physician Assistants and Nurse Practitioners) who all work together to provide you with the care you need, when you need it.  We recommend signing up for the patient portal called "MyChart".  Sign up information is provided on this After Visit Summary.  MyChart is used to connect with patients for Virtual Visits (Telemedicine).  Patients are able to view lab/test results, encounter notes, upcoming appointments, etc.  Non-urgent messages can be sent to your provider as well.   To learn more about what you can do with MyChart, go to [email or web address].    Your next appointment:   6 month(s)  The format for your next appointment:   In Person  Provider:   Kardie Tobb, DO   Other Instructions None   

## 2020-04-07 ENCOUNTER — Encounter: Payer: Self-pay | Admitting: Internal Medicine

## 2020-04-07 ENCOUNTER — Ambulatory Visit (INDEPENDENT_AMBULATORY_CARE_PROVIDER_SITE_OTHER): Payer: Medicare Other | Admitting: Internal Medicine

## 2020-04-07 ENCOUNTER — Other Ambulatory Visit: Payer: Self-pay

## 2020-04-07 VITALS — BP 134/66 | HR 72 | Ht 71.0 in | Wt 213.8 lb

## 2020-04-07 DIAGNOSIS — I493 Ventricular premature depolarization: Secondary | ICD-10-CM | POA: Diagnosis not present

## 2020-04-07 DIAGNOSIS — I4510 Unspecified right bundle-branch block: Secondary | ICD-10-CM | POA: Insufficient documentation

## 2020-04-07 DIAGNOSIS — R0600 Dyspnea, unspecified: Secondary | ICD-10-CM | POA: Diagnosis not present

## 2020-04-07 MED ORDER — AMIODARONE HCL 200 MG PO TABS: ORAL_TABLET | ORAL | 3 refills | Status: DC

## 2020-04-07 MED ORDER — HYDROCHLOROTHIAZIDE 25 MG PO TABS: 25.0000 mg | ORAL_TABLET | Freq: Every day | ORAL | 3 refills | Status: DC

## 2020-04-07 MED ORDER — AMIODARONE HCL 200 MG PO TABS: 200.0000 mg | ORAL_TABLET | Freq: Every day | ORAL | 0 refills | Status: DC

## 2020-04-07 MED ORDER — AMIODARONE HCL 200 MG PO TABS: ORAL_TABLET | ORAL | 0 refills | Status: DC

## 2020-04-07 NOTE — Patient Instructions (Addendum)
Medication Instructions:  Your physician has recommended you make the following change in your medication:   1 Amiodarone (200mg ) instructions   A. Take Amiodarone one tablet by mouth daily  B. Starting on May 07 2020 you will reduce your Amiodarone to one tablet by mouth daily except on Sunday.  C. Start on Sept 1 2021 you will reduce your Amiodarone again to one tablet by mouth daily except on Saturday and Sunday.   2 Stop taking Lisinopril   3 Start taking Hydrochlorothiazide (HCTZ) 25mg  one tablet by mouth daily  *If you need a refill on your cardiac medications before your next appointment, please call your pharmacy*  Lab Work:   Please get your lab work July 19th or 20th 2021 at the [place] office[facility]  If you have labs (blood work) drawn today and your tests are completely normal, you will receive your results only by: Marland Kitchen MyChart Message (if you have MyChart) OR . A paper copy in the mail If you have any lab test that is abnormal or we need to change your treatment, we will call you to review the results.  Testing/Procedures: None ordered.  Follow-Up: At [facility], you and your health needs are our priority.  As part of our continuing mission to provide you with exceptional heart care, we have created designated Provider Care Teams.  These Care Teams include your primary Cardiologist (physician) and Advanced Practice Providers (APPs -  Physician Assistants and Nurse Practitioners) who all work together to provide you with the care you need, when you need it.  We recommend signing up for the patient portal called "MyChart".  Sign up information is provided on this After Visit Summary.  MyChart is used to connect with patients for Virtual Visits (Telemedicine).  Patients are able to view lab/test results, encounter notes, upcoming appointments, etc.  Non-urgent messages can be sent to your provider as well.   To learn more about what you can do with MyChart, go to  [email or web address].    Your next appointment:   Your physician wants you to follow-up in: October with Dr. Lovena Le.   07/07/2020 at 1145 at the [address] office.   Other Instructions:

## 2020-04-07 NOTE — Progress Notes (Signed)
HPI [name] returns today for followup. He is a pleasant 74 yo man with a h/o PVC's who was started on amiodarone before I saw him in April. He has mildly decreased LV function. He had been tapered on the amiodarone. He had been bothered by peripheral edema and his amio was stopped in June. He has been coughing for 6 months. His bp has been fairly well controlled. No anginal symptoms.  No Known Allergies   Current Outpatient Medications  Medication Sig Dispense Refill  . acetaminophen (TYLENOL) 500 MG tablet Take 1,000 mg by mouth every 6 (six) hours as needed for moderate pain or headache.    . albuterol (VENTOLIN HFA) 108 (90 Base) MCG/ACT inhaler Inhale 1-2 puffs into the lungs every 6 (six) hours as needed for wheezing or shortness of breath.     Marland Kitchen amiodarone (PACERONE) 200 MG tablet Take 1 tablet (200 mg total) by mouth daily. 29 tablet 0  . amLODipine (NORVASC) 10 MG tablet Take 10 mg by mouth daily.    Marland Kitchen aspirin 81 MG EC tablet Take 81 mg by mouth daily.     . Multiple Vitamin (MULTIVITAMIN WITH MINERALS) TABS tablet Take 1 tablet by mouth daily.    . Naphazoline-Glycerin (REDNESS RELIEF OP) Place 1 drop into both eyes daily as needed (redness/ irritation).    . Omega-3 Fatty Acids (FISH OIL) 1000 MG CAPS Take 1,000 mg by mouth daily.    Marland Kitchen omeprazole (PRILOSEC) 20 MG capsule Take 20 mg by mouth daily.    Derrill Memo ON 05/07/2020] amiodarone (PACERONE) 200 MG tablet Take one table by mouth daily except sunday 31 tablet 0  . [START ON 06/07/2020] amiodarone (PACERONE) 200 MG tablet Take one tablet by mouth daily except Saturday and Sunday 90 tablet 3  . hydrochlorothiazide (HYDRODIURIL) 25 MG tablet Take 1 tablet (25 mg total) by mouth daily. 90 tablet 3  . nitroGLYCERIN (NITROSTAT) 0.4 MG SL tablet Place 1 tablet (0.4 mg total) under the tongue every 5 (five) minutes as needed. 30 tablet 3  . rosuvastatin (CRESTOR) 20 MG tablet Take 1 tablet (20 mg total) by mouth daily. 90 tablet  3   No current facility-administered medications for this visit.     Past Medical History:  Diagnosis Date  . Chest pain 10/20/2019  . Corn of toe 05/13/2016  . COVID-19 10/06/2019  . Dyslipidemia 10/20/2019  . Dyspnea on exertion 10/20/2019  . Essential hypertension 12/20/2014  . 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 Tinea pedis of both feet 05/13/2016    ROS:   All systems reviewed and negative except as noted in the HPI.   Past Surgical History:  Procedure Laterality Date  . CATARACT EXTRACTION Bilateral   . HERNIA REPAIR    . LEFT HEART CATH AND CORONARY ANGIOGRAPHY N/A 10/28/2019   Procedure: LEFT HEART CATH AND CORONARY ANGIOGRAPHY;  Surgeon: Leonie Man, MD;  Location: [place] [facility];  Service: Cardiovascular;  Laterality: N/A;  . REPLACEMENT TOTAL KNEE Bilateral   . TONSILLECTOMY       Family History  Problem Relation Age of Onset  . Seizures Mother      Social History   Socioeconomic History  . Marital status: Divorced    Spouse name: Not on file  . Number of children: Not on file  . Years of education: Not on file  . Highest education level: Not on file  Occupational History  . Not on file  Tobacco Use  . Smoking status: Former Smoker    Types: Cigarettes    Quit date: 2020    Years since quitting: 1.5  . Smokeless tobacco: Never Used  Substance and Sexual Activity  . Alcohol use: Yes    Alcohol/week: 2.0 standard drinks    Types: 2 Shots of liquor per week  . Drug use: Yes    Types: Marijuana  . Sexual activity: Not on file  Other Topics Concern  . Not on file  Social History Narrative  . Not on file   Social Determinants of Health   Financial Resource Strain:   . Difficulty of Paying Living Expenses:   Food Insecurity:   . Worried About Charity fundraiser in the Last Year:   . Arboriculturist in the Last Year:   Transportation Needs:   . Film/video editor (Medical):   Marland Kitchen Lack of Transportation (Non-Medical):   Physical  Activity:   . Days of Exercise per Week:   . Minutes of Exercise per Session:   Stress:   . Feeling of Stress :   Social Connections:   . Frequency of Communication with Friends and Family:   . Frequency of Social Gatherings with Friends and Family:   . Attends Religious Services:   . Active Member of Clubs or Organizations:   . Attends Archivist Meetings:   Marland Kitchen Marital Status:   Intimate Partner Violence:   . Fear of Current or Ex-Partner:   . Emotionally Abused:   Marland Kitchen Physically Abused:   . Sexually Abused:      BP 134/66   Pulse 72   Ht 5\' 11"  (1.803 m)   Wt 213 lb 12.8 oz (97 kg)   SpO2 95%   BMI 29.82 kg/m   Physical Exam:  Well appearing NAD HEENT: Unremarkable Neck:  No JVD, no thyromegally Lymphatics:  No adenopathy Back:  No CVA tenderness Lungs:  Clear with no wheezes HEART:  IRegular rate rhythm, no murmurs, no rubs, no clicks Abd:  soft, positive bowel sounds, no organomegally, no rebound, no guarding Ext:  2 plus pulses, no edema, no cyanosis, no clubbing Skin:  No rashes no nodules Neuro:  CN II through XII intact, motor grossly intact  EKG - nsr with frequent PVC's  Assess/Plan: 1. PVC's - his PVC's remain. Today his ECG showed 7 normal beats and 6 PVC's. He does not have palpitations but he feels fatigued. I strongly suspect that his cardiac output is reduced by the PVC's and I have asked him to restart the amiodarone. He will take 200 mg daily for a month before a slow taper.  2. Peripheral edema - he is on amlodipine. I asked him to continue amlodipine for now but I would imagine stopping this in the future.  3. HTN - his bp is minimally elevated. I asked him to start some HCTZ 4. Cough - I suspect that this is due to his lisinopril. I would imagine starting losartan pending his next visit.   [email or web address] [email or web address].D.

## 2020-04-12 NOTE — Addendum Note (Signed)
Addended by: Rose Phi on: 04/12/2020 04:47 PM   Modules accepted: Orders

## 2020-04-24 ENCOUNTER — Other Ambulatory Visit: Payer: Self-pay | Admitting: Internal Medicine

## 2020-04-24 ENCOUNTER — Other Ambulatory Visit: Payer: Self-pay

## 2020-04-24 DIAGNOSIS — I4510 Unspecified right bundle-branch block: Secondary | ICD-10-CM

## 2020-04-24 DIAGNOSIS — R0600 Dyspnea, unspecified: Secondary | ICD-10-CM | POA: Diagnosis not present

## 2020-04-24 DIAGNOSIS — I493 Ventricular premature depolarization: Secondary | ICD-10-CM | POA: Diagnosis not present

## 2020-04-25 LAB — BASIC METABOLIC PANEL
BUN/Creatinine Ratio: 15 (ref 10–24)
BUN: 20 mg/dL (ref 8–27)
CO2: 21 mmol/L (ref 20–29)
Calcium: 9.3 mg/dL (ref 8.6–10.2)
Chloride: 103 mmol/L (ref 96–106)
Creatinine, Ser: 1.33 mg/dL — ABNORMAL HIGH (ref 0.76–1.27)
GFR calc Af Amer: 60 mL/min/{1.73_m2} (ref >59)
GFR calc non Af Amer: 52 mL/min/{1.73_m2} — ABNORMAL LOW (ref >59)
Glucose: 88 mg/dL (ref 65–99)
Potassium: 4.5 mmol/L (ref 3.5–5.2)
Sodium: 140 mmol/L (ref 134–144)

## 2020-05-08 DIAGNOSIS — G4733 Obstructive sleep apnea (adult) (pediatric): Secondary | ICD-10-CM | POA: Diagnosis not present

## 2020-05-08 DIAGNOSIS — R5383 Other fatigue: Secondary | ICD-10-CM | POA: Diagnosis not present

## 2020-05-08 DIAGNOSIS — J4540 Moderate persistent asthma, uncomplicated: Secondary | ICD-10-CM | POA: Diagnosis not present

## 2020-05-25 DIAGNOSIS — R05 Cough: Secondary | ICD-10-CM | POA: Diagnosis not present

## 2020-05-25 DIAGNOSIS — J449 Chronic obstructive pulmonary disease, unspecified: Secondary | ICD-10-CM | POA: Diagnosis not present

## 2020-06-01 DIAGNOSIS — G4733 Obstructive sleep apnea (adult) (pediatric): Secondary | ICD-10-CM | POA: Diagnosis not present

## 2020-06-01 DIAGNOSIS — R5383 Other fatigue: Secondary | ICD-10-CM | POA: Diagnosis not present

## 2020-06-01 DIAGNOSIS — J4540 Moderate persistent asthma, uncomplicated: Secondary | ICD-10-CM | POA: Diagnosis not present

## 2020-06-14 DIAGNOSIS — R310 Gross hematuria: Secondary | ICD-10-CM | POA: Diagnosis not present

## 2020-06-14 DIAGNOSIS — K449 Diaphragmatic hernia without obstruction or gangrene: Secondary | ICD-10-CM | POA: Diagnosis not present

## 2020-06-14 DIAGNOSIS — R109 Unspecified abdominal pain: Secondary | ICD-10-CM | POA: Diagnosis not present

## 2020-06-14 DIAGNOSIS — I700 Atherosclerosis of aorta: Secondary | ICD-10-CM | POA: Diagnosis not present

## 2020-06-14 DIAGNOSIS — R309 Painful micturition, unspecified: Secondary | ICD-10-CM | POA: Diagnosis not present

## 2020-06-14 DIAGNOSIS — K7689 Other specified diseases of liver: Secondary | ICD-10-CM | POA: Diagnosis not present

## 2020-06-14 DIAGNOSIS — K769 Liver disease, unspecified: Secondary | ICD-10-CM | POA: Diagnosis not present

## 2020-06-19 DIAGNOSIS — R310 Gross hematuria: Secondary | ICD-10-CM | POA: Diagnosis not present

## 2020-06-19 DIAGNOSIS — R3912 Poor urinary stream: Secondary | ICD-10-CM | POA: Diagnosis not present

## 2020-06-19 DIAGNOSIS — R3915 Urgency of urination: Secondary | ICD-10-CM | POA: Diagnosis not present

## 2020-06-21 DIAGNOSIS — R109 Unspecified abdominal pain: Secondary | ICD-10-CM | POA: Diagnosis not present

## 2020-06-21 DIAGNOSIS — D7389 Other diseases of spleen: Secondary | ICD-10-CM | POA: Diagnosis not present

## 2020-06-21 DIAGNOSIS — K7689 Other specified diseases of liver: Secondary | ICD-10-CM | POA: Diagnosis not present

## 2020-06-21 DIAGNOSIS — K8020 Calculus of gallbladder without cholecystitis without obstruction: Secondary | ICD-10-CM | POA: Diagnosis not present

## 2020-06-21 DIAGNOSIS — K7460 Unspecified cirrhosis of liver: Secondary | ICD-10-CM | POA: Diagnosis not present

## 2020-07-06 DIAGNOSIS — R310 Gross hematuria: Secondary | ICD-10-CM | POA: Diagnosis not present

## 2020-07-07 ENCOUNTER — Ambulatory Visit: Payer: Medicare Other | Admitting: Internal Medicine

## 2020-07-19 ENCOUNTER — Other Ambulatory Visit: Payer: Self-pay

## 2020-07-19 ENCOUNTER — Encounter: Payer: Self-pay | Admitting: Student

## 2020-07-19 ENCOUNTER — Ambulatory Visit (INDEPENDENT_AMBULATORY_CARE_PROVIDER_SITE_OTHER): Payer: Medicare Other | Admitting: Student

## 2020-07-19 VITALS — BP 122/66 | HR 80 | Ht 71.0 in | Wt 205.0 lb

## 2020-07-19 DIAGNOSIS — I493 Ventricular premature depolarization: Secondary | ICD-10-CM

## 2020-07-19 DIAGNOSIS — R609 Edema, unspecified: Secondary | ICD-10-CM | POA: Diagnosis not present

## 2020-07-19 DIAGNOSIS — I10 Essential (primary) hypertension: Secondary | ICD-10-CM | POA: Diagnosis not present

## 2020-07-19 DIAGNOSIS — R310 Gross hematuria: Secondary | ICD-10-CM | POA: Diagnosis not present

## 2020-07-19 DIAGNOSIS — K5730 Diverticulosis of large intestine without perforation or abscess without bleeding: Secondary | ICD-10-CM | POA: Diagnosis not present

## 2020-07-19 DIAGNOSIS — N320 Bladder-neck obstruction: Secondary | ICD-10-CM | POA: Diagnosis not present

## 2020-07-19 LAB — COMPREHENSIVE METABOLIC PANEL
ALT: 11 IU/L (ref 0–44)
AST: 18 IU/L (ref 0–40)
Albumin/Globulin Ratio: 1.4 (ref 1.2–2.2)
Albumin: 4.5 g/dL (ref 3.7–4.7)
Alkaline Phosphatase: 68 IU/L (ref 44–121)
BUN/Creatinine Ratio: 12 (ref 10–24)
BUN: 18 mg/dL (ref 8–27)
Bilirubin Total: 0.4 mg/dL (ref 0.0–1.2)
CO2: 25 mmol/L (ref 20–29)
Calcium: 9.7 mg/dL (ref 8.6–10.2)
Chloride: 101 mmol/L (ref 96–106)
Creatinine, Ser: 1.55 mg/dL — ABNORMAL HIGH (ref 0.76–1.27)
GFR calc Af Amer: 50 mL/min/{1.73_m2} — ABNORMAL LOW (ref >59)
GFR calc non Af Amer: 43 mL/min/{1.73_m2} — ABNORMAL LOW (ref >59)
Globulin, Total: 3.2 g/dL (ref 1.5–4.5)
Glucose: 97 mg/dL (ref 65–99)
Potassium: 4.7 mmol/L (ref 3.5–5.2)
Sodium: 139 mmol/L (ref 134–144)
Total Protein: 7.7 g/dL (ref 6.0–8.5)

## 2020-07-19 LAB — TSH: TSH: 1.53 u[IU]/mL (ref 0.450–4.500)

## 2020-07-19 MED ORDER — AMIODARONE HCL 200 MG PO TABS: ORAL_TABLET | ORAL | 3 refills | Status: DC

## 2020-07-19 NOTE — Patient Instructions (Signed)
Medication Instructions: -- Please continue to take AMIODARONE 200 daily EXCEPT for Saturday and Sunday -- Refill Sent to Pharmacy  *If you need a refill on your cardiac medications before your next appointment, please call your pharmacy*  Lab Work: Your physician has recommended that you have lab work today: CMET and TSH  If you have labs (blood work) drawn today and your tests are completely normal, you will receive your results only by: Marland Kitchen MyChart Message (if you have MyChart) OR . A paper copy in the mail If you have any lab test that is abnormal or we need to change your treatment, we will call you to review the results.  Follow-Up: At [facility], you and your health needs are our priority.  As part of our continuing mission to provide you with exceptional heart care, we have created designated Provider Care Teams.  These Care Teams include your primary Cardiologist (physician) and Advanced Practice Providers (APPs -  Physician Assistants and Nurse Practitioners) who all work together to provide you with the care you need, when you need it.  We recommend signing up for the patient portal called "MyChart".  Sign up information is provided on this After Visit Summary.  MyChart is used to connect with patients for Virtual Visits (Telemedicine).  Patients are able to view lab/test results, encounter notes, upcoming appointments, etc.  Non-urgent messages can be sent to your provider as well.   To learn more about what you can do with MyChart, go to [email or web address].    Your next appointment:   Your physician recommends that you schedule a follow-up appointment in: 3 MONTHS with Dr. Lovena Le.   The format for your next appointment:   In Person with Cristopher Peru, MD

## 2020-07-19 NOTE — Progress Notes (Signed)
PCP:  Townsend Roger, MD Primary Cardiologist: Berniece Salines, DO Electrophysiologist: Cristopher Peru, MD   Luis Hood is a 74 y.o. male seen today for Cristopher Peru, MD for routine electrophysiology followup.  Since last being seen in our clinic the patient reports doing well overall. He feels about the same energy wise. Denies DOE. He actually isn't sure if he is still on amiodarone. He thinks he may have tapered and then stopped without refilling. He is going to check when he gets home.  he denies chest pain, palpitations, PND, orthopnea, nausea, vomiting, dizziness, syncope, edema, weight gain, or early satiety.  Past Medical History:  Diagnosis Date  . Chest pain 10/20/2019  . Corn of toe 05/13/2016  . COVID-19 10/06/2019  . Dyslipidemia 10/20/2019  . Dyspnea on exertion 10/20/2019  . Essential hypertension 12/20/2014  . 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 Tinea pedis of both feet 05/13/2016   Past Surgical History:  Procedure Laterality Date  . CATARACT EXTRACTION Bilateral   . HERNIA REPAIR    . LEFT HEART CATH AND CORONARY ANGIOGRAPHY N/A 10/28/2019   Procedure: LEFT HEART CATH AND CORONARY ANGIOGRAPHY;  Surgeon: Leonie Man, MD;  Location: [facility];  Service: Cardiovascular;  Laterality: N/A;  . REPLACEMENT TOTAL KNEE Bilateral   . TONSILLECTOMY      Current Outpatient Medications  Medication Sig Dispense Refill  . acetaminophen (TYLENOL) 500 MG tablet Take 1,000 mg by mouth every 6 (six) hours as needed for moderate pain or headache.    . albuterol (VENTOLIN HFA) 108 (90 Base) MCG/ACT inhaler Inhale 1-2 puffs into the lungs every 6 (six) hours as needed for wheezing or shortness of breath.     Marland Kitchen amLODipine (NORVASC) 10 MG tablet Take 10 mg by mouth daily.    . hydrochlorothiazide (HYDRODIURIL) 25 MG tablet Take 1 tablet (25 mg total) by mouth daily. 90 tablet 3  . Multiple Vitamin (MULTIVITAMIN WITH MINERALS) TABS tablet Take 1 tablet by mouth daily.    .  Naphazoline-Glycerin (REDNESS RELIEF OP) Place 1 drop into both eyes daily as needed (redness/ irritation).    . nitroGLYCERIN (NITROSTAT) 0.4 MG SL tablet Place 1 tablet (0.4 mg total) under the tongue every 5 (five) minutes as needed. 30 tablet 3  . Omega-3 Fatty Acids (FISH OIL) 1000 MG CAPS Take 1,000 mg by mouth daily.    Marland Kitchen omeprazole (PRILOSEC) 20 MG capsule Take 20 mg by mouth daily.    . rosuvastatin (CRESTOR) 20 MG tablet Take 1 tablet (20 mg total) by mouth daily. 90 tablet 3  . amiodarone (PACERONE) 200 MG tablet Take one tablet by mouth daily except Saturday and Sunday 90 tablet 3   No current facility-administered medications for this visit.    No Known Allergies  Social History   Socioeconomic History  . Marital status: Divorced    Spouse name: Not on file  . Number of children: Not on file  . Years of education: Not on file  . Highest education level: Not on file  Occupational History  . Not on file  Tobacco Use  . Smoking status: Former Smoker    Types: Cigarettes    Quit date: 2020    Years since quitting: 1.7  . Smokeless tobacco: Never Used  Substance and Sexual Activity  . Alcohol use: Yes    Alcohol/week: 2.0 standard drinks    Types: 2 Shots of liquor per week  . Drug use: Yes  Types: Marijuana  . Sexual activity: Not on file  Other Topics Concern  . Not on file  Social History Narrative  . Not on file   Social Determinants of Health   Financial Resource Strain:   . Difficulty of Paying Living Expenses: Not on file  Food Insecurity:   . Worried About Charity fundraiser in the Last Year: Not on file  . Ran Out of Food in the Last Year: Not on file  Transportation Needs:   . Lack of Transportation (Medical): Not on file  . Lack of Transportation (Non-Medical): Not on file  Physical Activity:   . Days of Exercise per Week: Not on file  . Minutes of Exercise per Session: Not on file  Stress:   . Feeling of Stress : Not on file  Social  Connections:   . Frequency of Communication with Friends and Family: Not on file  . Frequency of Social Gatherings with Friends and Family: Not on file  . Attends Religious Services: Not on file  . Active Member of Clubs or Organizations: Not on file  . Attends Archivist Meetings: Not on file  . Marital Status: Not on file  Intimate Partner Violence:   . Fear of Current or Ex-Partner: Not on file  . Emotionally Abused: Not on file  . Physically Abused: Not on file  . Sexually Abused: Not on file     Review of Systems: General: No chills, fever, night sweats or weight changes  Cardiovascular:  No chest pain, dyspnea on exertion, edema, orthopnea, palpitations, paroxysmal nocturnal dyspnea Dermatological: No rash, lesions or masses Respiratory: No cough, dyspnea Urologic: No hematuria, dysuria Abdominal: No nausea, vomiting, diarrhea, bright red blood per rectum, melena, or hematemesis Neurologic: No visual changes, weakness, changes in mental status All other systems reviewed and are otherwise negative except as noted above.  Physical Exam: Vitals:   07/19/20 1217  BP: 122/66  Pulse: 80  SpO2: 93%  Weight: 205 lb (93 kg)  Height: 5\' 11"  (1.803 m)    GEN- The patient is well appearing, alert and oriented x 3 today.   HEENT: normocephalic, atraumatic; sclera clear, conjunctiva pink; hearing intact; oropharynx clear; neck supple, no JVP Lymph- no cervical lymphadenopathy Lungs- Clear to ausculation bilaterally, normal work of breathing.  No wheezes, rales, rhonchi Heart- Regular rate and rhythm, no murmurs, rubs or gallops, PMI not laterally displaced GI- soft, non-tender, non-distended, bowel sounds present, no hepatosplenomegaly Extremities- no clubbing, cyanosis, or edema; DP/PT/radial pulses 2+ bilaterally MS- no significant deformity or atrophy Skin- warm and dry, no rash or lesion Psych- euthymic mood, full affect Neuro- strength and sensation are  intact  EKG is ordered. Personal review of EKG from today shows NSR at 80 bpm with frequent PVCs including bigeminy, trigeminy, and couplets  Additional studies reviewed include: Previous EP office notes, Echo 09/2019  Assessment and Plan:  1. 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  Will resume at 200 mg daily except sat/sunday, He is also going to check his meds when he gets home. He understands not to double up if he does still in fact have it at home. Labs today, and will follow up with him as to if he had at home.   2. HTN Stable today.  EF normal 09/2019.   3. Peripheral edema Appears stable today. No med changes  at this time.   Shirley Friar, PA-C  07/19/20 12:31 PM

## 2020-07-20 NOTE — Addendum Note (Signed)
Addended by: Campbell Riches on: 07/20/2020 01:16 PM   Modules accepted: Orders

## 2020-07-21 ENCOUNTER — Telehealth: Payer: Self-pay

## 2020-07-21 DIAGNOSIS — R002 Palpitations: Secondary | ICD-10-CM

## 2020-07-21 DIAGNOSIS — I10 Essential (primary) hypertension: Secondary | ICD-10-CM

## 2020-07-21 NOTE — Telephone Encounter (Signed)
-----   Message from Shirley Friar, PA-C sent at 07/21/2020  7:41 AM EDT ----- Can we please repeat BMET in 2 weeks to make sure it is not continuing to trend up?Thanks!Legrand Como [address] [place]ral, PA-C  07/21/2020 7:41 AM

## 2020-07-21 NOTE — Telephone Encounter (Signed)
Per DPR left detailed voicemail on home phone Instructed patient to call back and schedule lab appt for repeat BMET the week of 10/25 Orders In

## 2020-07-26 DIAGNOSIS — R310 Gross hematuria: Secondary | ICD-10-CM | POA: Diagnosis not present

## 2020-07-26 DIAGNOSIS — R3912 Poor urinary stream: Secondary | ICD-10-CM | POA: Diagnosis not present

## 2020-08-03 ENCOUNTER — Other Ambulatory Visit: Payer: Self-pay

## 2020-08-03 ENCOUNTER — Other Ambulatory Visit: Payer: Medicare Other | Admitting: *Deleted

## 2020-08-03 DIAGNOSIS — R002 Palpitations: Secondary | ICD-10-CM | POA: Diagnosis not present

## 2020-08-03 DIAGNOSIS — I10 Essential (primary) hypertension: Secondary | ICD-10-CM | POA: Diagnosis not present

## 2020-08-03 LAB — BASIC METABOLIC PANEL
BUN/Creatinine Ratio: 11 (ref 10–24)
BUN: 15 mg/dL (ref 8–27)
CO2: 22 mmol/L (ref 20–29)
Calcium: 9.6 mg/dL (ref 8.6–10.2)
Chloride: 104 mmol/L (ref 96–106)
Creatinine, Ser: 1.42 mg/dL — ABNORMAL HIGH (ref 0.76–1.27)
GFR calc Af Amer: 56 mL/min/{1.73_m2} — ABNORMAL LOW (ref >59)
GFR calc non Af Amer: 48 mL/min/{1.73_m2} — ABNORMAL LOW (ref >59)
Glucose: 95 mg/dL (ref 65–99)
Potassium: 4.1 mmol/L (ref 3.5–5.2)
Sodium: 142 mmol/L (ref 134–144)

## 2020-08-09 DIAGNOSIS — K7460 Unspecified cirrhosis of liver: Secondary | ICD-10-CM | POA: Diagnosis not present

## 2020-10-17 ENCOUNTER — Other Ambulatory Visit: Payer: Self-pay

## 2020-10-17 ENCOUNTER — Ambulatory Visit (INDEPENDENT_AMBULATORY_CARE_PROVIDER_SITE_OTHER): Payer: Medicare Other | Admitting: Internal Medicine

## 2020-10-17 ENCOUNTER — Encounter: Payer: Self-pay | Admitting: Internal Medicine

## 2020-10-17 VITALS — BP 136/76 | HR 51 | Ht 71.0 in | Wt 211.8 lb

## 2020-10-17 DIAGNOSIS — I493 Ventricular premature depolarization: Secondary | ICD-10-CM

## 2020-10-17 DIAGNOSIS — I10 Essential (primary) hypertension: Secondary | ICD-10-CM | POA: Diagnosis not present

## 2020-10-17 NOTE — Patient Instructions (Addendum)
Medication Instructions:  Your physician recommends that you continue on your current medications as directed. Please refer to the Current Medication list given to you today.  Labwork: None ordered.  Testing/Procedures: None ordered.  Follow-Up: The following dates are available for procedures: February 4, 8, 14, 16 and 21  Any Other Special Instructions Will Be Listed Below (If Applicable).  If you need a refill on your cardiac medications before your next appointment, please call your pharmacy.    Pacemaker Implantation, Adult Pacemaker implantation is a procedure to place a pacemaker inside the chest. A pacemaker is a small computer that sends electrical signals to the heart and helps the heart beat normally. A pacemaker also stores information about heart rhythms. You may need pacemaker implantation if you have:  A slow heartbeat (bradycardia).  Loss of consciousness that happens repeatedly (syncope) or repeated episodes of dizziness or light-headedness because of an irregular heart rate.  Shortness of breath (dyspnea) due to heart problems. The pacemaker usually attaches to your heart through a wire called a lead. One or two leads may be needed. There are different types of pacemakers:  Transvenous pacemaker. This type is placed under the skin or muscle of your upper chest area. The lead goes through a vein in the chest area to reach the inside of the heart.  Epicardial pacemaker. This type is placed under the skin or muscle of your chest or abdomen. The lead goes through your chest to the outside of the heart. Tell a health care provider about:  Any allergies you have.  All medicines you are taking, including vitamins, herbs, eye drops, creams, and over-the-counter medicines.  Any problems you or family members have had with anesthetic medicines.  Any blood or bone disorders you have.  Any surgeries you have had.  Any medical conditions you have.  Whether you are  pregnant or may be pregnant. What are the risks? Generally, this is a safe procedure. However, problems may occur, including:  Infection.  Bleeding.  Failure of the pacemaker or the lead.  Collapse of a lung or bleeding into a lung.  Blood clot inside a blood vessel with a lead.  Damage to the heart.  Infection inside the heart (endocarditis).  Allergic reactions to medicines. What happens before the procedure? Staying hydrated Follow instructions from your health care provider about hydration, which may include:  Up to 2 hours before the procedure - you may continue to drink clear liquids, such as water, clear fruit juice, black coffee, and plain tea.   Eating and drinking restrictions Follow instructions from your health care provider about eating and drinking, which may include:  8 hours before the procedure - stop eating heavy meals or foods, such as meat, fried foods, or fatty foods.  6 hours before the procedure - stop eating light meals or foods, such as toast or cereal.  6 hours before the procedure - stop drinking milk or drinks that contain milk.  2 hours before the procedure - stop drinking clear liquids. Medicines Ask your health care provider about:  Changing or stopping your regular medicines. This is especially important if you are taking diabetes medicines or blood thinners.  Taking medicines such as aspirin and ibuprofen. These medicines can thin your blood. Do not take these medicines unless your health care provider tells you to take them.  Taking over-the-counter medicines, vitamins, herbs, and supplements. Tests You may have:  A heart evaluation. This may include: ? An electrocardiogram (ECG). This involves placing  patches on your skin to check your heart rhythm. ? A chest X-ray. ? An echocardiogram. This is a test that uses sound waves (ultrasound) to produce an image of the heart. ? A cardiac rhythm monitor. This is used to record your heart  rhythm and any events for a longer period of time.  Blood tests.  Genetic testing. General instructions  Do not use any products that contain nicotine or tobacco for at least 4 weeks before the procedure. These products include cigarettes, e-cigarettes, and chewing tobacco. If you need help quitting, ask your health care provider.  Ask your health care provider: ? How your surgery site will be marked. ? What steps will be taken to help prevent infection. These steps may include:  Removing hair at the surgery site.  Washing skin with a germ-killing soap.  Receiving antibiotic medicine.  Plan to have someone take you home from the hospital or clinic.  If you will be going home right after the procedure, plan to have someone with you for 24 hours. What happens during the procedure?  An IV will be inserted into one of your veins.  You will be given one or more of the following: ? A medicine to help you relax (sedative). ? A medicine to numb the area (local anesthetic). ? A medicine to make you fall asleep (general anesthetic).  The next steps vary depending on the type of pacemaker you will be getting. ? If you are getting a transvenous pacemaker:  An incision will be made in your upper chest.  A pocket will be made for the pacemaker. It may be placed under the skin or between layers of muscle.  The lead will be inserted into a blood vessel that goes to the heart.  While X-rays are taken by an imaging machine (fluoroscopy), the lead will be advanced through the vein to the inside of your heart.  The other end of the lead will be tunneled under the skin and attached to the pacemaker. ? If you are getting an epicardial pacemaker:  An incision will be made near your ribs or breastbone (sternum) for the lead.  The lead will be attached to the outside of your heart.  Another incision will be made in your chest or upper abdomen to create a pocket for the pacemaker.  The free  end of the lead will be tunneled under the skin and attached to the pacemaker.  The transvenous or epicardial pacemaker will be tested. Imaging studies may be done to check the lead position.  The incisions will be closed with stitches (sutures), adhesive strips, or skin glue.  Bandages (dressings) will be placed over the incisions. The procedure may vary among health care providers and hospitals. What happens after the procedure?  Your blood pressure, heart rate, breathing rate, and blood oxygen level will be monitored until you leave the hospital or clinic.  You may be given antibiotics.  You will be given pain medicine.  An ECG and chest X-rays will be done.  You may need to wear a continuous type of ECG (Holter monitor) to check your heart rhythm.  Your health care provider will program the pacemaker.  If you were given a sedative during the procedure, it can affect you for several hours. Do not drive or operate machinery until your health care provider says that it is safe.  You will be given a pacemaker identification card. This card lists the implant date, device model, and manufacturer of your pacemaker.  Summary  A pacemaker is a small computer that sends electrical signals to the heart and helps the heart beat normally.  There are different types of pacemakers. A pacemaker may be placed under the skin or muscle of your chest or abdomen.  Follow instructions from your health care provider about eating and drinking and about taking medicines before the procedure. This information is not intended to replace advice given to you by your health care provider. Make sure you discuss any questions you have with your health care provider. Document Revised: 08/25/2019 Document Reviewed: 08/25/2019 Elsevier Patient Education  2021 [employer].

## 2020-10-17 NOTE — Progress Notes (Signed)
  HPI Luis Hood returns today for followup of his PVC's and LV dysfunction. He is a pleasant 75 yo man with HTN who has been treated for PVC's with amiodarone. He initially had some cough and his ACE inhibitor was stopped. His cough resolved. He also has sinus node dysfunction. He notes that he has had some difficulty with exertion. He gets tired and sob when he walks. No edema.  No Known Allergies         Current Outpatient Medications  Medication Sig Dispense Refill  . acetaminophen (TYLENOL) 500 MG tablet Take 1,000 mg by mouth every 6 (six) hours as needed for moderate pain or headache.    . albuterol (VENTOLIN HFA) 108 (90 Base) MCG/ACT inhaler Inhale 1-2 puffs into the lungs every 6 (six) hours as needed for wheezing or shortness of breath.     . amiodarone (PACERONE) 200 MG tablet Take one tablet by mouth daily except Saturday and Sunday 90 tablet 3  . amLODipine (NORVASC) 10 MG tablet Take 10 mg by mouth daily.    . Multiple Vitamin (MULTIVITAMIN WITH MINERALS) TABS tablet Take 1 tablet by mouth daily.    . Naphazoline-Glycerin (REDNESS RELIEF OP) Place 1 drop into both eyes daily as needed (redness/ irritation).    . Omega-3 Fatty Acids (FISH OIL) 1000 MG CAPS Take 1,000 mg by mouth daily.    . omeprazole (PRILOSEC) 20 MG capsule Take 20 mg by mouth daily.    . hydrochlorothiazide (HYDRODIURIL) 25 MG tablet Take 1 tablet (25 mg total) by mouth daily. 90 tablet 3  . nitroGLYCERIN (NITROSTAT) 0.4 MG SL tablet Place 1 tablet (0.4 mg total) under the tongue every 5 (five) minutes as needed. 30 tablet 3  . rosuvastatin (CRESTOR) 20 MG tablet Take 1 tablet (20 mg total) by mouth daily. 90 tablet 3   No current facility-administered medications for this visit.         Past Medical History:  Diagnosis Date  . Chest pain 10/20/2019  . Corn of toe 05/13/2016  . COVID-19 10/06/2019  . Dyslipidemia 10/20/2019  . Dyspnea on exertion 10/20/2019  . Essential  hypertension 12/20/2014  . 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 Tinea pedis of both feet 05/13/2016    ROS:   All systems reviewed and negative except as noted in the HPI.        Past Surgical History:  Procedure Laterality Date  . CATARACT EXTRACTION Bilateral   . HERNIA REPAIR    . LEFT HEART CATH AND CORONARY ANGIOGRAPHY N/A 10/28/2019   Procedure: LEFT HEART CATH AND CORONARY ANGIOGRAPHY;  Surgeon: Harding, David W, MD;  Location: [facility];  Service: Cardiovascular;  Laterality: N/A;  . REPLACEMENT TOTAL KNEE Bilateral   . TONSILLECTOMY            Family History  Problem Relation Age of Onset  . Seizures Mother      Social History        Socioeconomic History  . Marital status: Divorced    Spouse name: Not on file  . Number of children: Not on file  . Years of education: Not on file  . Highest education level: Not on file  Occupational History  . Not on file  Tobacco Use  . Smoking status: Former Smoker    Types: Cigarettes    Quit date: 2020    Years since quitting: 2.0  . Smokeless tobacco: Never Used  Substance and Sexual Activity  .   Alcohol use: Yes    Alcohol/week: 2.0 standard drinks    Types: 2 Shots of liquor per week  . Drug use: Yes    Types: Marijuana  . Sexual activity: Not on file  Other Topics Concern  . Not on file  Social History Narrative  . Not on file   Social Determinants of Health   Financial Resource Strain: Not on file  Food Insecurity: Not on file  Transportation Needs: Not on file  Physical Activity: Not on file  Stress: Not on file  Social Connections: Not on file  Intimate Partner Violence: Not on file     BP 136/76   Pulse (!) 51   Ht 5' 11" (1.803 m)   Wt 211 lb 12.8 oz (96.1 kg)   SpO2 96%   BMI 29.54 kg/m   Physical Exam:  Well appearing NAD HEENT: Unremarkable Neck:  No JVD, no thyromegally Lymphatics:  No adenopathy Back:  No CVA tenderness Lungs:   Clear with no wheezes HEART:  Regular rate rhythm, no murmurs, no rubs, no clicks Abd:  soft, positive bowel sounds, no organomegally, no rebound, no guarding Ext:  2 plus pulses, no edema, no cyanosis, no clubbing Skin:  No rashes no nodules Neuro:  CN II through XII intact, motor grossly intact  EKG - sinus bradycardia with RBBB  Assess/Plan: 1. Sinus node dysfunction - today I had the patient walk around our office. His initial HR was 52 and went up to 56 on pulse oximetry. He felt tired. I have reviewed the treatment options with the patient and the risks/benefits/goals/expectations of PPM insertion were reviewed and he wishes to proceed. 2. PAF - he appears to be maintaining NSR nicely on low dose amiodarone.  3. HTN  - his bp is well controlled. We will follow. 4. Cough - we were initially concerned that this might be due to amio but it has resolved with cessation of his ACE.  Jazia Faraci,MD  

## 2020-10-20 ENCOUNTER — Telehealth: Payer: Self-pay | Admitting: Internal Medicine

## 2020-10-20 DIAGNOSIS — I4589 Other specified conduction disorders: Secondary | ICD-10-CM

## 2020-10-20 NOTE — Telephone Encounter (Signed)
Patient is requesting to schedule a PPM implantation procedure, previously discussed during appointment on 10/17/20.

## 2020-10-20 NOTE — Telephone Encounter (Signed)
Made pt aware Sonia Baller, RN is not back in the office until after Monday.  Aware she will be in touch to arrange his procedure. Pt agreeable

## 2020-10-24 NOTE — Telephone Encounter (Signed)
Outreach made to Pt.  Pt scheduled for PPM on 11/27/20 at 7:30 am with GT  Lab work/covid test on 11/24/20  Will meet with Pt on 11/24/20 to go over instructions and give soap.  Work up complete

## 2020-11-13 DIAGNOSIS — I495 Sick sinus syndrome: Secondary | ICD-10-CM | POA: Diagnosis not present

## 2020-11-13 DIAGNOSIS — I10 Essential (primary) hypertension: Secondary | ICD-10-CM | POA: Diagnosis not present

## 2020-11-13 DIAGNOSIS — J449 Chronic obstructive pulmonary disease, unspecified: Secondary | ICD-10-CM | POA: Diagnosis not present

## 2020-11-14 ENCOUNTER — Telehealth: Payer: Self-pay | Admitting: Internal Medicine

## 2020-11-14 NOTE — Telephone Encounter (Signed)
The patient reports low HR and feeling "swimmy headed" and "a little off balance" today.  His HR by BP cuff today was 46. It is usually around 55 bpm. At his visit with Dr. Lovena Le 1/11, his HR was 51 bpm and increased to 56 on a walk around the office.  PPM implant is scheduled 2/21. He did not take his medications yesterday but took everything as directed today.  He denies feeling like he may faint.  ER precautions reviewed - he will proceed if his HR lowers more and/or if his symptoms worsen. He understands not to drive himself if this occurs. He understands to call 911 if he faints.  He understands he will be called if Dr. Lovena Le has recommendations.

## 2020-11-14 NOTE — Telephone Encounter (Signed)
STAT if HR is under 50 or over 120 (normal HR is 60-100 beats per minute)  1) What is your heart rate? 46  2) Do you have a log of your heart rate readings (document readings)? no  3) Do you have any other symptoms? Dizzy   Patient states his HR has been low this morning and he feels dizzy.

## 2020-11-16 NOTE — Telephone Encounter (Signed)
Pacemaker should help. Continue as scheduled. If he passes out then we will need to reschedule for an earlier date. GT

## 2020-11-16 NOTE — Telephone Encounter (Signed)
Confirmed with the patient he has not fainted. He understands to call 911 if so as he will need PPM earlier. He was grateful for call and agrees with plan.

## 2020-11-24 ENCOUNTER — Other Ambulatory Visit: Payer: Medicare Other | Admitting: *Deleted

## 2020-11-24 ENCOUNTER — Other Ambulatory Visit (HOSPITAL_COMMUNITY): Payer: Medicare Other

## 2020-11-24 ENCOUNTER — Other Ambulatory Visit: Payer: Self-pay

## 2020-11-24 DIAGNOSIS — I4589 Other specified conduction disorders: Secondary | ICD-10-CM

## 2020-11-24 DIAGNOSIS — Z1152 Encounter for screening for COVID-19: Secondary | ICD-10-CM | POA: Diagnosis not present

## 2020-11-24 LAB — BASIC METABOLIC PANEL
BUN/Creatinine Ratio: 14 (ref 10–24)
BUN: 19 mg/dL (ref 8–27)
CO2: 20 mmol/L (ref 20–29)
Calcium: 9.4 mg/dL (ref 8.6–10.2)
Chloride: 104 mmol/L (ref 96–106)
Creatinine, Ser: 1.38 mg/dL — ABNORMAL HIGH (ref 0.76–1.27)
GFR calc Af Amer: 58 mL/min/{1.73_m2} — ABNORMAL LOW (ref >59)
GFR calc non Af Amer: 50 mL/min/{1.73_m2} — ABNORMAL LOW (ref >59)
Glucose: 93 mg/dL (ref 65–99)
Potassium: 4.2 mmol/L (ref 3.5–5.2)
Sodium: 140 mmol/L (ref 134–144)

## 2020-11-24 LAB — CBC WITH DIFFERENTIAL/PLATELET
Basophils Absolute: 0 10*3/uL (ref 0.0–0.2)
Basos: 1 %
EOS (ABSOLUTE): 0.4 10*3/uL (ref 0.0–0.4)
Eos: 6 %
Hematocrit: 39 % (ref 37.5–51.0)
Hemoglobin: 13.2 g/dL (ref 13.0–17.7)
Immature Grans (Abs): 0 10*3/uL (ref 0.0–0.1)
Immature Granulocytes: 0 %
Lymphocytes Absolute: 1.8 10*3/uL (ref 0.7–3.1)
Lymphs: 30 %
MCH: 28.3 pg (ref 26.6–33.0)
MCHC: 33.8 g/dL (ref 31.5–35.7)
MCV: 84 fL (ref 79–97)
Monocytes Absolute: 0.4 10*3/uL (ref 0.1–0.9)
Monocytes: 7 %
Neutrophils Absolute: 3.3 10*3/uL (ref 1.4–7.0)
Neutrophils: 56 %
Platelets: 201 10*3/uL (ref 150–450)
RBC: 4.66 x10E6/uL (ref 4.14–5.80)
RDW: 13.8 % (ref 11.6–15.4)
WBC: 5.9 10*3/uL (ref 3.4–10.8)

## 2020-11-24 NOTE — Progress Notes (Signed)
Instructed patient on the following items: Arrival time 0530 Nothing to eat or drink after midnight No meds AM of procedure Responsible person to drive you home and stay with you for 24 hrs Wash with special soap night before and morning of procedure  

## 2020-11-27 ENCOUNTER — Ambulatory Visit (HOSPITAL_COMMUNITY): Payer: Medicare Other

## 2020-11-27 ENCOUNTER — Ambulatory Visit (HOSPITAL_COMMUNITY)
Admission: RE | Disposition: A | Discharge status: Home / Self Care | Payer: Medicare Other | Source: Home / Self Care | Attending: Internal Medicine

## 2020-11-27 ENCOUNTER — Other Ambulatory Visit: Payer: Self-pay

## 2020-11-27 ENCOUNTER — Ambulatory Visit (HOSPITAL_COMMUNITY)
Admission: RE | Admit: 2020-11-27 | Discharge: 2020-11-27 | Disposition: A | Discharge status: Home / Self Care | Payer: Medicare Other | Attending: Internal Medicine | Admitting: Internal Medicine

## 2020-11-27 DIAGNOSIS — I495 Sick sinus syndrome: Secondary | ICD-10-CM

## 2020-11-27 DIAGNOSIS — Z87891 Personal history of nicotine dependence: Secondary | ICD-10-CM | POA: Diagnosis not present

## 2020-11-27 DIAGNOSIS — Z950 Presence of cardiac pacemaker: Secondary | ICD-10-CM | POA: Diagnosis not present

## 2020-11-27 DIAGNOSIS — Z20822 Contact with and (suspected) exposure to covid-19: Secondary | ICD-10-CM | POA: Diagnosis not present

## 2020-11-27 DIAGNOSIS — I10 Essential (primary) hypertension: Secondary | ICD-10-CM | POA: Insufficient documentation

## 2020-11-27 DIAGNOSIS — I517 Cardiomegaly: Secondary | ICD-10-CM | POA: Diagnosis not present

## 2020-11-27 DIAGNOSIS — Z79899 Other long term (current) drug therapy: Secondary | ICD-10-CM | POA: Insufficient documentation

## 2020-11-27 DIAGNOSIS — I480 Paroxysmal atrial fibrillation: Secondary | ICD-10-CM | POA: Insufficient documentation

## 2020-11-27 HISTORY — PX: PACEMAKER IMPLANT: EP1218

## 2020-11-27 LAB — SARS CORONAVIRUS 2 BY RT PCR (HOSPITAL ORDER, PERFORMED IN ~~LOC~~ HOSPITAL LAB): SARS Coronavirus 2: NEGATIVE

## 2020-11-27 SURGERY — PACEMAKER IMPLANT

## 2020-11-27 MED ORDER — ONDANSETRON HCL 4 MG/2ML IJ SOLN: 4.0000 mg | Freq: Four times a day (QID) | INTRAMUSCULAR | Status: DC | PRN

## 2020-11-27 MED ORDER — CHLORHEXIDINE GLUCONATE 4 % EX LIQD: 4.0000 "application " | Freq: Once | CUTANEOUS | Status: DC

## 2020-11-27 MED ORDER — POVIDONE-IODINE 10 % EX SWAB: 2.0000 "application " | Freq: Once | CUTANEOUS | Status: DC

## 2020-11-27 MED ORDER — SODIUM CHLORIDE 0.9 % IV SOLN
INTRAVENOUS | Status: AC
  Filled 2020-11-27: qty 2

## 2020-11-27 MED ORDER — CEFAZOLIN SODIUM-DEXTROSE 1-4 GM/50ML-% IV SOLN: 1.0000 g | Freq: Four times a day (QID) | INTRAVENOUS | Status: DC

## 2020-11-27 MED ORDER — MIDAZOLAM HCL 5 MG/5ML IJ SOLN
INTRAMUSCULAR | Status: DC | PRN
  Administered 2020-11-27 (×2): 1 mg via INTRAVENOUS

## 2020-11-27 MED ORDER — HEPARIN (PORCINE) IN NACL 1000-0.9 UT/500ML-% IV SOLN
INTRAVENOUS | Status: DC | PRN
  Administered 2020-11-27: 500 mL

## 2020-11-27 MED ORDER — SODIUM CHLORIDE 0.9 % IV SOLN
80.0000 mg | INTRAVENOUS | Status: AC
  Administered 2020-11-27: 80 mg

## 2020-11-27 MED ORDER — CEFAZOLIN SODIUM-DEXTROSE 2-4 GM/100ML-% IV SOLN
2.0000 g | INTRAVENOUS | Status: AC
  Administered 2020-11-27: 2 g via INTRAVENOUS

## 2020-11-27 MED ORDER — HEPARIN (PORCINE) IN NACL 1000-0.9 UT/500ML-% IV SOLN
INTRAVENOUS | Status: AC
  Filled 2020-11-27: qty 500

## 2020-11-27 MED ORDER — LIDOCAINE HCL 1 % IJ SOLN
INTRAMUSCULAR | Status: AC
  Filled 2020-11-27: qty 20

## 2020-11-27 MED ORDER — LIDOCAINE HCL (PF) 1 % IJ SOLN
INTRAMUSCULAR | Status: DC | PRN
  Administered 2020-11-27: 50 mL

## 2020-11-27 MED ORDER — FENTANYL CITRATE (PF) 100 MCG/2ML IJ SOLN
INTRAMUSCULAR | Status: AC
  Filled 2020-11-27: qty 2

## 2020-11-27 MED ORDER — CEFAZOLIN SODIUM-DEXTROSE 2-4 GM/100ML-% IV SOLN
INTRAVENOUS | Status: AC
  Filled 2020-11-27: qty 100

## 2020-11-27 MED ORDER — ACETAMINOPHEN 325 MG PO TABS
325.0000 mg | ORAL_TABLET | ORAL | Status: DC | PRN
  Administered 2020-11-27: 650 mg via ORAL
  Filled 2020-11-27: qty 2

## 2020-11-27 MED ORDER — SODIUM CHLORIDE 0.9 % IV SOLN: INTRAVENOUS | Status: DC

## 2020-11-27 MED ORDER — CEFAZOLIN SODIUM-DEXTROSE 1-4 GM/50ML-% IV SOLN
1.0000 g | Freq: Four times a day (QID) | INTRAVENOUS | Status: DC
  Administered 2020-11-27: 1 g via INTRAVENOUS
  Filled 2020-11-27: qty 50

## 2020-11-27 MED ORDER — MIDAZOLAM HCL 5 MG/5ML IJ SOLN
INTRAMUSCULAR | Status: AC
  Filled 2020-11-27: qty 5

## 2020-11-27 MED ORDER — FENTANYL CITRATE (PF) 100 MCG/2ML IJ SOLN
INTRAMUSCULAR | Status: DC | PRN
  Administered 2020-11-27 (×2): 12.5 ug via INTRAVENOUS

## 2020-11-27 SURGICAL SUPPLY — 8 items
CABLE SURGICAL S-101-97-12 (CABLE) ×2 IMPLANT
LEAD SOLIA S PRO MRI 53 (Lead) ×2 IMPLANT
LEAD SOLIA S PRO MRI 60 (Lead) ×2 IMPLANT
MAT PREVALON FULL STRYKER (MISCELLANEOUS) ×2 IMPLANT
PACEMAKER EDORA 8DR-T MRI (Pacemaker) ×2 IMPLANT
PAD PRO RADIOLUCENT 2001M-C (PAD) ×2 IMPLANT
SHEATH 7FR PRELUDE SNAP 13 (SHEATH) ×4 IMPLANT
TRAY PACEMAKER INSERTION (PACKS) ×2 IMPLANT

## 2020-11-27 NOTE — Progress Notes (Signed)
Dr. Lovena Le made aware of CxR result being okay. No Evidence of Acute cardiopulmonary abnormality, unchanged cardiomegaly. Read by Dr. Raj Janus.

## 2020-11-27 NOTE — H&P (Signed)
HPI [name] returns today for followup of his PVC's and LV dysfunction. He is a pleasant 75 yo man with HTN who has been treated for PVC's with amiodarone. He initially had some cough and his ACE inhibitor was stopped. His cough resolved. He also has sinus node dysfunction. He notes that he has had some difficulty with exertion. He gets tired and sob when he walks. No edema.  No Known Allergies         Current Outpatient Medications  Medication Sig Dispense Refill  . acetaminophen (TYLENOL) 500 MG tablet Take 1,000 mg by mouth every 6 (six) hours as needed for moderate pain or headache.    . albuterol (VENTOLIN HFA) 108 (90 Base) MCG/ACT inhaler Inhale 1-2 puffs into the lungs every 6 (six) hours as needed for wheezing or shortness of breath.     Marland Kitchen amiodarone (PACERONE) 200 MG tablet Take one tablet by mouth daily except Saturday and Sunday 90 tablet 3  . amLODipine (NORVASC) 10 MG tablet Take 10 mg by mouth daily.    . Multiple Vitamin (MULTIVITAMIN WITH MINERALS) TABS tablet Take 1 tablet by mouth daily.    . Naphazoline-Glycerin (REDNESS RELIEF OP) Place 1 drop into both eyes daily as needed (redness/ irritation).    . Omega-3 Fatty Acids (FISH OIL) 1000 MG CAPS Take 1,000 mg by mouth daily.    Marland Kitchen omeprazole (PRILOSEC) 20 MG capsule Take 20 mg by mouth daily.    . hydrochlorothiazide (HYDRODIURIL) 25 MG tablet Take 1 tablet (25 mg total) by mouth daily. 90 tablet 3  . nitroGLYCERIN (NITROSTAT) 0.4 MG SL tablet Place 1 tablet (0.4 mg total) under the tongue every 5 (five) minutes as needed. 30 tablet 3  . rosuvastatin (CRESTOR) 20 MG tablet Take 1 tablet (20 mg total) by mouth daily. 90 tablet 3   No current facility-administered medications for this visit.         Past Medical History:  Diagnosis Date  . Chest pain 10/20/2019  . Corn of toe 05/13/2016  . COVID-19 10/06/2019  . Dyslipidemia 10/20/2019  . Dyspnea on exertion 10/20/2019  . Essential  hypertension 12/20/2014  . 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 Tinea pedis of both feet 05/13/2016    ROS:   All systems reviewed and negative except as noted in the HPI.        Past Surgical History:  Procedure Laterality Date  . CATARACT EXTRACTION Bilateral   . HERNIA REPAIR    . LEFT HEART CATH AND CORONARY ANGIOGRAPHY N/A 10/28/2019   Procedure: LEFT HEART CATH AND CORONARY ANGIOGRAPHY;  Surgeon: Leonie Man, MD;  Location: [facility];  Service: Cardiovascular;  Laterality: N/A;  . REPLACEMENT TOTAL KNEE Bilateral   . TONSILLECTOMY            Family History  Problem Relation Age of Onset  . Seizures Mother      Social History        Socioeconomic History  . Marital status: Divorced    Spouse name: Not on file  . Number of children: Not on file  . Years of education: Not on file  . Highest education level: Not on file  Occupational History  . Not on file  Tobacco Use  . Smoking status: Former Smoker    Types: Cigarettes    Quit date: 2020    Years since quitting: 2.0  . Smokeless tobacco: Never Used  Substance and Sexual Activity  .  Alcohol use: Yes    Alcohol/week: 2.0 standard drinks    Types: 2 Shots of liquor per week  . Drug use: Yes    Types: Marijuana  . Sexual activity: Not on file  Other Topics Concern  . Not on file  Social History Narrative  . Not on file   Social Determinants of Health   Financial Resource Strain: Not on file  Food Insecurity: Not on file  Transportation Needs: Not on file  Physical Activity: Not on file  Stress: Not on file  Social Connections: Not on file  Intimate Partner Violence: Not on file     BP 136/76   Pulse (!) 51   Ht 5\' 11"  (1.803 m)   Wt 211 lb 12.8 oz (96.1 kg)   SpO2 96%   BMI 29.54 kg/m   Physical Exam:  Well appearing NAD HEENT: Unremarkable Neck:  No JVD, no thyromegally Lymphatics:  No adenopathy Back:  No CVA tenderness Lungs:   Clear with no wheezes HEART:  Regular rate rhythm, no murmurs, no rubs, no clicks Abd:  soft, positive bowel sounds, no organomegally, no rebound, no guarding Ext:  2 plus pulses, no edema, no cyanosis, no clubbing Skin:  No rashes no nodules Neuro:  CN II through XII intact, motor grossly intact  EKG - sinus bradycardia with RBBB  Assess/Plan: 1. Sinus node dysfunction - today I had the patient walk around our office. His initial HR was 52 and went up to 56 on pulse oximetry. He felt tired. I have reviewed the treatment options with the patient and the risks/benefits/goals/expectations of PPM insertion were reviewed and he wishes to proceed. 2. PAF - he appears to be maintaining NSR nicely on low dose amiodarone.  3. HTN  - his bp is well controlled. We will follow. 4. Cough - we were initially concerned that this might be due to [facility] but it has resolved with cessation of his ACE.  Carleene Overlie Danaye Sobh,MD

## 2020-11-27 NOTE — Discharge Instructions (Signed)
After Your Pacemaker   . You have a Biotronik Pacemaker  ACTIVITY . Do not lift your arm above shoulder height for 1 week after your procedure. After 7 days, you may progress as below.  . You should remove your sling 24 hours after your procedure, unless otherwise instructed by your provider.     Monday December 04, 2020  Tuesday December 05, 2020 Wednesday December 06, 2020 Thursday December 07, 2020   . Do not lift, push, pull, or carry anything over 10 pounds with the affected arm until 6 weeks (Monday January 08, 2021 ) after your procedure.   . Do NOT DRIVE until you have been seen for your wound check and keep site dry till March 3, or as long as instructed by your healthcare provider.   . Ask your healthcare provider when you can go back to work   INCISION/Dressing  . If large square, outer bandage is left in place, this can be removed after 24 hours from your procedure. Do not remove steri-strips or glue as below.  . Remove arm sling in 24 hours  . Monitor your Pacemaker site for redness, swelling, and drainage. Call the device clinic at [phone] if you experience these symptoms or fever/chills.  . If your incision is sealed with Steri-strips or staples, you may shower 10 days after your procedure or when told by your provider. Do not remove the steri-strips or let the shower hit directly on your site. You may wash around your site with soap and water.    Marland Kitchen Avoid lotions, ointments, or perfumes over your incision until it is well-healed.  . You may use a hot tub or a pool AFTER your wound check appointment if the incision is completely closed.  Marland Kitchen PAcemaker Alerts:  Some alerts are vibratory and others beep. These are NOT emergencies. Please call our office to let us know. If this occurs at night or on weekends, it can wait until the next business day. Send a remote transmission.  . If your device is capable of reading fluid status (for heart failure), you will be offered monthly  monitoring to review this with you.   DEVICE MANAGEMENT . Remote monitoring is used to monitor your pacemaker from home. This monitoring is scheduled every 91 days by our office. It allows [place] to keep an eye on the functioning of your device to ensure it is working properly. You will routinely see your Electrophysiologist annually (more often if necessary).   . You should receive your ID card for your new device in 4-8 weeks. Keep this card with you at all times once received. Consider wearing a medical alert bracelet or necklace.  . Your Pacemaker may be MRI compatible. This will be discussed at your next office visit/wound check.  You should avoid contact with strong electric or magnetic fields.    Do not use amateur (ham) radio equipment or electric (arc) welding torches. MP3 player headphones with magnets should not be used. Some devices are safe to use if held at least 12 inches (30 cm) from your Pacemaker. These include power tools, lawn mowers, and speakers. If you are unsure if something is safe to use, ask your health care provider.   When using your cell phone, hold it to the ear that is on the opposite side from the Pacemaker. Do not leave your cell phone in a pocket over the Pacemaker.   You may safely use electric blankets, heating pads, computers, and microwave ovens.  Call the office right away if:  You have chest pain.  You feel more short of breath than you have felt before.  You feel more light-headed than you have felt before.  Your incision starts to open up.  This information is not intended to replace advice given to you by your health care provider. Make sure you discuss any questions you have with your health care provider.

## 2020-11-28 ENCOUNTER — Telehealth: Payer: Self-pay | Admitting: Physician Assistant

## 2020-11-28 ENCOUNTER — Encounter (HOSPITAL_COMMUNITY): Payer: Self-pay | Admitting: Internal Medicine

## 2020-11-28 MED FILL — Lidocaine HCl Local Inj 1%: INTRAMUSCULAR | Qty: 60 | Status: AC

## 2020-11-28 NOTE — Telephone Encounter (Signed)
Follow-up after same day discharge: Implant date: 11/27/20 MD: Dr. Lovena Le Device: Biotronk PPM Location: L chest   Wound check visit: 12/07/20 @ 11:20AM 90 day MD follow-up: 02/27/21 @ 1:45  Remote Transmission received: none yet I will send a staff message to device clinic to follow up for transmission overnight tonight, the patient reports is plugged in at his bedside within 6 feet of where he sleeps  Dressing removed: tegaderm was removed by MD prior to leaving the hospital per the patient, steri strips are in place, pt eports site looks great. Arm sling is off. Patient feels well, only a slight ache at site.   Luis Harrison, PA-C

## 2020-11-28 NOTE — Telephone Encounter (Signed)
Opened in error

## 2020-11-28 NOTE — Telephone Encounter (Deleted)
-----   Message from Simone Curia, RN sent at 11/28/2020  2:02 PM EST -----  Same day DC PPM GT  Thank you!  Legrand Como [address]" [place], PA-C  11/27/2020 11:28 AM

## 2020-12-01 ENCOUNTER — Other Ambulatory Visit: Payer: Self-pay | Admitting: Cardiology

## 2020-12-01 ENCOUNTER — Telehealth: Payer: Self-pay

## 2020-12-01 NOTE — Telephone Encounter (Signed)
Called patient and states he has unplugged it and plugged the box back in. Reached out to [place] with [employer]ik to see if he is able to see on his end if the monitor is connected in the system. States he will check and get back when he has update.

## 2020-12-01 NOTE — Telephone Encounter (Signed)
Refill sent to pharmacy.   

## 2020-12-01 NOTE — Telephone Encounter (Signed)
-----   Message from [facility], [place] sent at 11/28/2020  4:07 PM EST ----- Device has not transmitted yet to Biotronik site.  He has it plugged in at his bedside, can you follow  up on transmission please?  thx ----- Message ----- From: Simone Curia, RN Sent: 11/28/2020   2:14 PM EST To: [place], PA-C   ----- Message ----- From: Shirley Friar, Hershal Coria Sent: 11/27/2020  11:28 AM EST To: [facility] Device   Same day DC PPM GT  Thank you!  Legrand Como [address]" [place], PA-C  11/27/2020 11:28 AM

## 2020-12-07 ENCOUNTER — Other Ambulatory Visit: Payer: Self-pay

## 2020-12-07 ENCOUNTER — Ambulatory Visit (INDEPENDENT_AMBULATORY_CARE_PROVIDER_SITE_OTHER): Payer: Medicare Other | Admitting: Emergency Medicine

## 2020-12-07 DIAGNOSIS — I495 Sick sinus syndrome: Secondary | ICD-10-CM

## 2020-12-07 LAB — CUP PACEART INCLINIC DEVICE CHECK
Brady Statistic RA Percent Paced: 90 %
Brady Statistic RV Percent Paced: 1 %
Date Time Interrogation Session: 20220303123623
Implantable Lead Implant Date: 20220221
Implantable Lead Implant Date: 20220221
Implantable Lead Location: 753859
Implantable Lead Location: 753860
Implantable Lead Model: 377171
Implantable Lead Model: 377171
Implantable Lead Serial Number: 8000149199
Implantable Lead Serial Number: 8000190251
Implantable Pulse Generator Implant Date: 20220221
Lead Channel Impedance Value: 526 Ohm
Lead Channel Impedance Value: 643 Ohm
Lead Channel Pacing Threshold Amplitude: 0.9 V
Lead Channel Pacing Threshold Amplitude: 1 V
Lead Channel Pacing Threshold Pulse Width: 0.4 ms
Lead Channel Pacing Threshold Pulse Width: 0.4 ms
Lead Channel Sensing Intrinsic Amplitude: 11.1 mV
Lead Channel Sensing Intrinsic Amplitude: 6.8 mV
Lead Channel Sensing Intrinsic Amplitude: 7.4 mV
Lead Channel Setting Pacing Amplitude: 3 V
Lead Channel Setting Pacing Amplitude: 3 V
Lead Channel Setting Pacing Pulse Width: 0.4 ms
Pulse Gen Model: 407145
Pulse Gen Serial Number: 70050696

## 2020-12-07 NOTE — Progress Notes (Signed)
Wound check appointment. Steri-strips removed. Wound without redness or edema. Incision edges approximated, wound well healed. Normal device function. Thresholds, sensing, and impedances consistent with implant measurements. Device programmed at 3.0V for extra safety margin until 3 month visit. Histogram distribution appropriate for patient and level of activity. No mode switches or high ventricular rates noted. Patient educated about wound care, arm mobility, lifting restrictions. ROV with Dr. Lovena Le on 02/27/21.  Patient is enrolled in remote monitoring.  He has been working with [employer] support to [employer] why monitor is not transmitting nightly.  Last transmission received 12/01/20, next scheduled summary due on 02/26/21.

## 2020-12-07 NOTE — Telephone Encounter (Signed)
Device is connected in Biot[employer]ik.

## 2021-02-12 DIAGNOSIS — Z950 Presence of cardiac pacemaker: Secondary | ICD-10-CM | POA: Diagnosis not present

## 2021-02-12 DIAGNOSIS — I10 Essential (primary) hypertension: Secondary | ICD-10-CM | POA: Diagnosis not present

## 2021-02-26 ENCOUNTER — Ambulatory Visit (INDEPENDENT_AMBULATORY_CARE_PROVIDER_SITE_OTHER): Payer: Medicare Other

## 2021-02-26 DIAGNOSIS — I495 Sick sinus syndrome: Secondary | ICD-10-CM | POA: Diagnosis not present

## 2021-02-27 ENCOUNTER — Encounter: Payer: Self-pay | Admitting: Internal Medicine

## 2021-02-27 ENCOUNTER — Other Ambulatory Visit: Payer: Self-pay

## 2021-02-27 ENCOUNTER — Ambulatory Visit (INDEPENDENT_AMBULATORY_CARE_PROVIDER_SITE_OTHER): Payer: Medicare Other | Admitting: Internal Medicine

## 2021-02-27 VITALS — BP 122/84 | HR 82 | Ht 71.0 in | Wt 212.2 lb

## 2021-02-27 DIAGNOSIS — I493 Ventricular premature depolarization: Secondary | ICD-10-CM

## 2021-02-27 DIAGNOSIS — Z950 Presence of cardiac pacemaker: Secondary | ICD-10-CM | POA: Diagnosis not present

## 2021-02-27 DIAGNOSIS — I495 Sick sinus syndrome: Secondary | ICD-10-CM | POA: Diagnosis not present

## 2021-02-27 DIAGNOSIS — I10 Essential (primary) hypertension: Secondary | ICD-10-CM

## 2021-02-27 LAB — CUP PACEART REMOTE DEVICE CHECK
Date Time Interrogation Session: 20220524111302
Implantable Lead Implant Date: 20220221
Implantable Lead Implant Date: 20220221
Implantable Lead Location: 753859
Implantable Lead Location: 753860
Implantable Lead Model: 377171
Implantable Lead Model: 377171
Implantable Lead Serial Number: 8000149199
Implantable Lead Serial Number: 8000190251
Implantable Pulse Generator Implant Date: 20220221
Pulse Gen Model: 407145
Pulse Gen Serial Number: 70050696

## 2021-02-27 NOTE — Progress Notes (Signed)
HPI [name] returns today for followup of his AT, PVC's and LV dysfunction. He is a pleasant 75 yo Hood with HTN who has been treated for PVC's with amiodarone. He initially had some cough and his ACE inhibitor was stopped. His cough resolved. He also has sinus node dysfunction and underwent PPM insertion 3 months ago. He notes that since his PPM insertion, No edema. He denies chest pain or sob.  No Known Allergies   Current Outpatient Medications  Medication Sig Dispense Refill  . acetaminophen (TYLENOL) 500 MG tablet Take 1,000 mg by mouth every 6 (six) hours as needed for moderate pain or headache.    . albuterol (VENTOLIN HFA) 108 (90 Base) MCG/ACT inhaler Inhale 1-2 puffs into the lungs every 6 (six) hours as needed for wheezing or shortness of breath.     Marland Kitchen amiodarone (PACERONE) 200 MG tablet Take one tablet by mouth daily except Saturday and Sunday 90 tablet 3  . amLODipine (NORVASC) 10 MG tablet Take 10 mg by mouth daily.    . hydrochlorothiazide (HYDRODIURIL) 25 MG tablet Take 25 mg by mouth daily.    . Multiple Vitamin (MULTIVITAMIN WITH MINERALS) TABS tablet Take 1 tablet by mouth daily.    . Naphazoline-Glycerin (REDNESS RELIEF OP) Place 1 drop into both eyes daily as needed (redness/ irritation).    . nitroGLYCERIN (NITROSTAT) 0.4 MG SL tablet Place 0.4 mg under the tongue every 5 (five) minutes as needed for chest pain.    . Omega-3 Fatty Acids (FISH OIL) 1000 MG CAPS Take 1,000 mg by mouth daily.    Marland Kitchen omeprazole (PRILOSEC) 20 MG capsule Take 20 mg by mouth daily.    . rosuvastatin (CRESTOR) 20 MG tablet TAKE 1 TABLET BY MOUTH ONCE DAILY 90 tablet 0   No current facility-administered medications for this visit.     Past Medical History:  Diagnosis Date  . Chest pain 10/20/2019  . Corn of toe 05/13/2016  . COVID-19 10/06/2019  . Dyslipidemia 10/20/2019  . Dyspnea on exertion 10/20/2019  . Essential hypertension 12/20/2014  . 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  Tinea pedis of both feet 05/13/2016    ROS:   All systems reviewed and negative except as noted in the HPI.   Past Surgical History:  Procedure Laterality Date  . CATARACT EXTRACTION Bilateral   . HERNIA REPAIR    . LEFT HEART CATH AND CORONARY ANGIOGRAPHY N/A 10/28/2019   Procedure: LEFT HEART CATH AND CORONARY ANGIOGRAPHY;  Surgeon: Luis Man, MD;  Location: [facility];  Service: Cardiovascular;  Laterality: N/A;  . PACEMAKER IMPLANT N/A 11/27/2020   Procedure: PACEMAKER IMPLANT;  Surgeon: Evans Lance, MD;  Location: [facility];  Service: Cardiovascular;  Laterality: N/A;  . REPLACEMENT TOTAL KNEE Bilateral   . TONSILLECTOMY       Family History  Problem Relation Age of Onset  . Seizures Mother      Social History   Socioeconomic History  . Marital status: Widowed    Spouse name: Not on file  . Number of children: Not on file  . Years of education: Not on file  . Highest education level: Not on file  Occupational History  . Not on file  Tobacco Use  . Smoking status: Former Smoker    Types: Cigarettes    Quit date: 2020    Years since quitting: 2.3  . Smokeless tobacco: Never Used  Vaping Use  .  Vaping Use: Never used  Substance and Sexual Activity  . Alcohol use: Yes    Alcohol/week: 2.0 standard drinks    Types: 2 Shots of liquor per week  . Drug use: Yes    Types: Marijuana  . Sexual activity: Not on file  Other Topics Concern  . Not on file  Social History Narrative   ** Merged History Encounter **       Social Determinants of Health   Financial Resource Strain: Not on file  Food Insecurity: Not on file  Transportation Needs: Not on file  Physical Activity: Not on file  Stress: Not on file  Social Connections: Not on file  Intimate Partner Violence: Not on file     BP 122/84   Pulse 82   Ht 5\' 11"  (1.803 m)   Wt 212 lb 3.2 oz (96.3 kg)   SpO2 99%   BMI 29.60 kg/m   Physical Exam:  Well appearing 75 yo Hood,  NAD HEENT: Unremarkable Neck:  No JVD, no thyromegally Lymphatics:  No adenopathy Back:  No CVA tenderness Lungs:  Clear with no wheezes HEART:  Regular rate rhythm, no murmurs, no rubs, no clicks Abd:  soft, positive bowel sounds, no organomegally, no rebound, no guarding Ext:  2 plus pulses, no edema, no cyanosis, no clubbing Skin:  No rashes no nodules Neuro:  CN II through XII intact, motor grossly intact  EKG - nsr with atrial pacing and 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  See PaceArt for details.   Assess/Plan: 1. Sinus node dysfunction - he has undergone PPM insertion after his HR would not go above 60 when he walked in the office 3 months ago. He feels well with more energy. 2. PVC/AT - he appears to be maintaining NSR nicely on low dose amiodarone.  3. HTN  - his bp is well controlled. We will follow. 4. Cough - we were initially concerned that this might be due to [facility] but it has resolved with cessation of his ACE.  [name],MD

## 2021-02-27 NOTE — Patient Instructions (Addendum)
Medication Instructions:  Your physician recommends that you continue on your current medications as directed. Please refer to the Current Medication list given to you today.  Labwork: None ordered.  Testing/Procedures: None ordered.  Follow-Up: Your physician wants you to follow-up in: one year with Cristopher Peru, MD or one of the following Advanced Practice Providers on your designated Care Team:    Chanetta Marshall, NP  Tommye Standard, PA-C  Legrand Como "Jonni Sanger" [place], [place]  Remote monitoring is used to monitor your Pacemaker from home. This monitoring reduces the number of office visits required to check your device to one time per year. It allows [place] to keep an eye on the functioning of your device to ensure it is working properly. You are scheduled for a device check from home on 05/28/2021. You may send your transmission at any time that day. If you have a wireless device, the transmission will be sent automatically. After your physician reviews your transmission, you will receive a postcard with your next transmission date.  Any Other Special Instructions Will Be Listed Below (If Applicable).  If you need a refill on your cardiac medications before your next appointment, please call your pharmacy.

## 2021-03-17 ENCOUNTER — Other Ambulatory Visit: Payer: Self-pay | Admitting: Cardiology

## 2021-03-19 NOTE — Telephone Encounter (Signed)
Rosuvastatin 20 mg # 90 only sent to pharmacy with message to patient needs appointment for further refills / 1st attempt

## 2021-03-20 NOTE — Progress Notes (Signed)
Remote pacemaker transmission.   

## 2021-04-19 ENCOUNTER — Other Ambulatory Visit: Payer: Self-pay | Admitting: Internal Medicine

## 2021-05-16 DIAGNOSIS — I700 Atherosclerosis of aorta: Secondary | ICD-10-CM | POA: Diagnosis not present

## 2021-05-16 DIAGNOSIS — I2510 Atherosclerotic heart disease of native coronary artery without angina pectoris: Secondary | ICD-10-CM | POA: Diagnosis not present

## 2021-05-16 DIAGNOSIS — I495 Sick sinus syndrome: Secondary | ICD-10-CM | POA: Diagnosis not present

## 2021-05-16 DIAGNOSIS — Z0000 Encounter for general adult medical examination without abnormal findings: Secondary | ICD-10-CM | POA: Diagnosis not present

## 2021-05-16 DIAGNOSIS — N182 Chronic kidney disease, stage 2 (mild): Secondary | ICD-10-CM | POA: Diagnosis not present

## 2021-05-16 DIAGNOSIS — E7800 Pure hypercholesterolemia, unspecified: Secondary | ICD-10-CM | POA: Diagnosis not present

## 2021-05-16 DIAGNOSIS — J449 Chronic obstructive pulmonary disease, unspecified: Secondary | ICD-10-CM | POA: Diagnosis not present

## 2021-05-16 DIAGNOSIS — I10 Essential (primary) hypertension: Secondary | ICD-10-CM | POA: Diagnosis not present

## 2021-05-16 DIAGNOSIS — Z950 Presence of cardiac pacemaker: Secondary | ICD-10-CM | POA: Diagnosis not present

## 2021-05-16 DIAGNOSIS — Z87891 Personal history of nicotine dependence: Secondary | ICD-10-CM | POA: Diagnosis not present

## 2021-06-25 ENCOUNTER — Other Ambulatory Visit: Payer: Self-pay | Admitting: Cardiology

## 2021-07-26 ENCOUNTER — Other Ambulatory Visit: Payer: Self-pay | Admitting: Cardiology

## 2021-08-08 DIAGNOSIS — R55 Syncope and collapse: Secondary | ICD-10-CM | POA: Diagnosis not present

## 2021-08-08 DIAGNOSIS — R1310 Dysphagia, unspecified: Secondary | ICD-10-CM | POA: Diagnosis not present

## 2021-08-08 DIAGNOSIS — Z950 Presence of cardiac pacemaker: Secondary | ICD-10-CM | POA: Diagnosis not present

## 2021-08-17 ENCOUNTER — Telehealth: Payer: Self-pay | Admitting: Internal Medicine

## 2021-08-17 NOTE — Telephone Encounter (Signed)
Patient following up   Scheduled pt for 08/23/21 with Tommye Standard.

## 2021-08-17 NOTE — Telephone Encounter (Signed)
  Pt c/o Syncope: STAT if syncope occurred within 30 minutes and pt complains of lightheadedness High Priority if episode of passing out, completely, today or in last 24 hours   Did you pass out today? No  When is the last time you passed out?   Has this occurred multiple times?   Did you have any symptoms prior to passing out? Pt was coughing  Mindy with Dr. Doran Durand office called, they would like for pt to be seen because pt been having syncope spells, pt passed out every time he cough, they want pt's pacemake to be checked

## 2021-08-17 NOTE — Telephone Encounter (Signed)
Called and spoke with patient to request he bring biotronik home monitor with him to his appointment 11/17 with Tommye Standard, PA. Patient has not connected since May 2022. He was automatically discontinued in [employer]ronik. Has been manually reactivated but patient will need to reconnect in office after manual interrogation. Patient verbalized understanding. Reinforced importance of remote monitoring.

## 2021-08-19 NOTE — Progress Notes (Signed)
Cardiology Office Note Date:  08/23/2021  Patient ID:  Luis Hood, DOB [birth year], MRN [id number] PCP:  Townsend Roger, MD  Cardiologist:  Dr.Tobb Electrophysiologist: Dr. Lovena Le    Chief Complaint:  syncope  History of Present Illness: Luis Hood is a 75 y.o. male with history of HTN, PVCs, AT, sinus node dysfunction w/PPM  He comes in today to be seen for Dr. Lovena Le, last seen by him May 2022, at that time doing well, discussed his cough was initially worrisome given his amiodarone though resolved off ACE, was doing well. No changes were made, maintaining SR.  Phone notes report the patient's PMD office called that the patient was suffering cough syncope and wanted his pacer checked.  Attempts to get remote sent was unsuccessful and patient given an appt and instructed to bring his home transmitter.  TODAY He reports he does not think he is fainting but when he gets into the coughing fits that can be so bad he "kind of shakes all over" "not a seizure, but like it" He does not collapse and does not think he I fainting. His cough is provoked he says when eating, but mostly drinking some goes down the airway and causes him to cough. Says he has a chronic irritation/phlegm at the back ohis throat and makes him cough, he thinks might be post nasal gtt, he isn't sure. He does not feel like his cough is driven by his lungs Denies ant kind of SOB at rest or exertional No CP, palpitations or cardiac awareness N on ear syncope or syncope when not coughing.  He says he has been referred to ENT and ses them next month.    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, NSVT   Past Medical History:  Diagnosis Date   Chest pain 10/20/2019   Corn of toe 05/13/2016   COVID-19 10/06/2019   Dyslipidemia 10/20/2019   Dyspnea on exertion 10/20/2019   Essential hypertension 12/20/2014   Hammer toes of both feet 05/13/2016    Tinea pedis of both feet 05/13/2016    Past Surgical History:  Procedure Laterality Date   CATARACT EXTRACTION Bilateral    HERNIA REPAIR     LEFT HEART CATH AND CORONARY ANGIOGRAPHY N/A 10/28/2019   Procedure: LEFT HEART CATH AND CORONARY ANGIOGRAPHY;  Surgeon: Leonie Man, MD;  Location: [facility];  Service: Cardiovascular;  Laterality: N/A;   PACEMAKER IMPLANT N/A 11/27/2020   Procedure: PACEMAKER IMPLANT;  Surgeon: Evans Lance, MD;  Location: [facility];  Service: Cardiovascular;  Laterality: N/A;   REPLACEMENT TOTAL KNEE Bilateral    TONSILLECTOMY      Current Outpatient Medications  Medication Sig Dispense Refill   acetaminophen (TYLENOL) 500 MG tablet Take 1,000 mg by mouth every 6 (six) hours as needed for moderate pain or headache.     albuterol (VENTOLIN HFA) 108 (90 Base) MCG/ACT inhaler Inhale 1-2 puffs into the lungs every 6 (six) hours as needed for wheezing or shortness of breath.      amiodarone (PACERONE) 200 MG tablet Take one tablet by mouth daily except Saturday and Sunday 90 tablet 3   amLODipine (NORVASC) 10 MG tablet Take 10 mg by mouth daily.     FARXIGA 10 MG TABS tablet Take 10 mg by mouth every morning.     FLOVENT HFA 110 MCG/ACT inhaler Inhale 2 puffs into the lungs as needed.     hydrochlorothiazide (  HYDRODIURIL) 25 MG tablet TAKE 1 TABLET ONCE DAILY 90 tablet 3   Multiple Vitamin (MULTIVITAMIN WITH MINERALS) TABS tablet Take 1 tablet by mouth daily.     Naphazoline-Glycerin (REDNESS RELIEF OP) Place 1 drop into both eyes daily as needed (redness/ irritation).     nitroGLYCERIN (NITROSTAT) 0.4 MG SL tablet Place 0.4 mg under the tongue every 5 (five) minutes as needed for chest pain.     Omega-3 Fatty Acids (FISH OIL) 1000 MG CAPS Take 1,000 mg by mouth daily.     omeprazole (PRILOSEC) 20 MG capsule Take 20 mg by mouth daily.     rosuvastatin (CRESTOR) 20 MG tablet Take 1 tablet (20 mg total) by mouth daily. Patient needs appointment  for further refills. 1 st attempt 30 tablet 0   No current facility-administered medications for this visit.    Allergies:   Patient has no known allergies.   Social History:  The patient  reports that he quit smoking about 2 years ago. His smoking use included cigarettes. He has never used smokeless tobacco. He reports current alcohol use of about 2.0 standard drinks per week. He reports current drug use. Drug: Marijuana.   Family History:  The patient's family history includes Seizures in his mother.  ROS:  Please see the history of present illness.    All other systems are reviewed and otherwise negative.   PHYSICAL EXAM:  VS:  BP 122/80   Pulse 68   Ht 5\' 11"  (1.803 m)   Wt 215 lb 12.8 oz (97.9 kg)   SpO2 98%   BMI 30.10 kg/m  BMI: Body mass index is 30.1 kg/m. Well nourished, well developed, in no acute distress HEENT: normocephalic, atraumatic Neck: no JVD, carotid bruits or masses Cardiac:  RRR; no significant murmurs, no rubs, or gallops Lungs:  CTA b/l, no wheezing, rhonchi or rales Abd: soft, nontender MS: no deformity or atrophy Ext: no edema Skin: warm and dry, no rash Neuro:  No gross deficits appreciated Psych: euthymic mood, full affect  PPM site is stable, no tethering or discomfort   EKG:  Done today and reviewed by myself shows  A paced, RBBB, LAD, no significant changes  Device interrogation done today by industry and reviewed by myself:  Battery and lead measurements are good One HVR episode 07/20/21, 6 seconds, NSVT One AT episode, 03/09/21, brief AFlutter, 3 minutes 95% AP 1% VP    10/28/2019: LHC There is mild left ventricular systolic dysfunction. The left ventricular ejection fraction is 45-50% by visual estimate. LV end diastolic pressure is mildly elevated. In the LV appears to be somewhat dilated with thickened myocardium. 1st Mrg lesion is 55% stenosed. Prox LAD to Mid LAD lesion is 20% stenosed.   SUMMARY Angiographically moderate  single-vessel disease with a moderate caliber OM1 having 50% stenosis, otherwise angiographically normal coronary arteries. Globular dilated left ventricle but with actually relatively preserved EF.  Difficult to really assess true wall motion normality.  Recommend 2D echo and consider cardiac MRI given the suspicion of significant LVH..    10/05/2019: TTE LVEF 50-55% WMA c/w BBB    Recent Labs: 11/24/2020: BUN 19; Creatinine, Ser 1.38; Hemoglobin 13.2; Platelets 201; Potassium 4.2; Sodium 140  No results found for requested labs within last 8760 hours.   CrCl cannot be calculated (Patient's most recent lab result is older than the maximum 21 days allowed.).   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  The patient did not have any concerns regarding medicines.  Venetia Night, PA-C 08/23/2021 10:50 PM     [address] [place] [address] [phone] (office)  [phone] (fax)

## 2021-08-23 ENCOUNTER — Ambulatory Visit (INDEPENDENT_AMBULATORY_CARE_PROVIDER_SITE_OTHER): Payer: Medicare Other | Admitting: Physician Assistant

## 2021-08-23 ENCOUNTER — Other Ambulatory Visit: Payer: Self-pay

## 2021-08-23 ENCOUNTER — Encounter: Payer: Self-pay | Admitting: Physician Assistant

## 2021-08-23 VITALS — BP 122/80 | HR 68 | Ht 71.0 in | Wt 215.8 lb

## 2021-08-23 DIAGNOSIS — I493 Ventricular premature depolarization: Secondary | ICD-10-CM

## 2021-08-23 DIAGNOSIS — R55 Syncope and collapse: Secondary | ICD-10-CM | POA: Diagnosis not present

## 2021-08-23 DIAGNOSIS — I471 Supraventricular tachycardia: Secondary | ICD-10-CM

## 2021-08-23 DIAGNOSIS — R054 Cough syncope: Secondary | ICD-10-CM | POA: Diagnosis not present

## 2021-08-23 DIAGNOSIS — Z79899 Other long term (current) drug therapy: Secondary | ICD-10-CM | POA: Diagnosis not present

## 2021-08-23 DIAGNOSIS — Z950 Presence of cardiac pacemaker: Secondary | ICD-10-CM

## 2021-08-23 NOTE — Patient Instructions (Addendum)
  Medication Instructions:   Your physician recommends that you continue on your current medications as directed. Please refer to the Current Medication list given to you today.  *If you need a refill on your cardiac medications before your next appointment, please call your pharmacy*   Lab Work: LFT AND TSH TODAY   If you have labs (blood work) drawn today and your tests are completely normal, you will receive your results only by: [place] (if you have MyChart) OR A paper copy in the mail If you have any lab test that is abnormal or we need to change your treatment, we will call you to review the results.   Testing/Procedures: Your physician has recommended that you have a pulmonary function test. Pulmonary Function Tests are a group of tests that measure how well air moves in and out of your lungs.      Follow-Up: At [facility], you and your health needs are our priority.  As part of our continuing mission to provide you with exceptional heart care, we have created designated Provider Care Teams.  These Care Teams include your primary Cardiologist (physician) and Advanced Practice Providers (APPs -  Physician Assistants and Nurse Practitioners) who all work together to provide you with the care you need, when you need it.  We recommend signing up for the patient portal called "MyChart".  Sign up information is provided on this After Visit Summary.  MyChart is used to connect with patients for Virtual Visits (Telemedicine).  Patients are able to view lab/test results, encounter notes, upcoming appointments, etc.  Non-urgent messages can be sent to your provider as well.   To learn more about what you can do with MyChart, go to [email or web address].    Your next appointment:   6 month(s)  The format for your next appointment:   In Person  Provider:  Dr. Harriet Masson and APP     Other Instructions

## 2021-08-24 DIAGNOSIS — I10 Essential (primary) hypertension: Secondary | ICD-10-CM | POA: Diagnosis not present

## 2021-08-24 DIAGNOSIS — I495 Sick sinus syndrome: Secondary | ICD-10-CM | POA: Diagnosis not present

## 2021-08-27 ENCOUNTER — Ambulatory Visit (INDEPENDENT_AMBULATORY_CARE_PROVIDER_SITE_OTHER): Payer: Medicare Other

## 2021-08-27 DIAGNOSIS — I495 Sick sinus syndrome: Secondary | ICD-10-CM

## 2021-08-28 LAB — CUP PACEART REMOTE DEVICE CHECK
Date Time Interrogation Session: 20221121083102
Implantable Lead Implant Date: 20220221
Implantable Lead Implant Date: 20220221
Implantable Lead Location: 753859
Implantable Lead Location: 753860
Implantable Lead Model: 377171
Implantable Lead Model: 377171
Implantable Lead Serial Number: 8000149199
Implantable Lead Serial Number: 8000190251
Implantable Pulse Generator Implant Date: 20220221
Pulse Gen Model: 407145
Pulse Gen Serial Number: 70050696

## 2021-09-05 NOTE — Progress Notes (Signed)
Remote pacemaker transmission.   

## 2021-09-06 ENCOUNTER — Other Ambulatory Visit: Payer: Self-pay | Admitting: Student

## 2021-09-12 ENCOUNTER — Telehealth: Payer: Self-pay

## 2021-09-12 NOTE — Addendum Note (Signed)
Addended by: Douglass Rivers D on: 09/12/2021 09:37 AM   Modules accepted: Level of Service

## 2021-09-12 NOTE — Telephone Encounter (Signed)
I spoke with the patient and with [employer]. [employer] tech support states the patient needed a reset. The patient states when he came to his appointment on 08/23/2021 he did bring his monitor with him. He described the rep who helped him.   I gave Kerrie [place] rep a call. He states the patient live in an area that does not have good signal. He suggested that the patient drive around with the monitor than plug it back up at night.   The patient verbalized understanding. I told him that I will look to see if the monitor transmit tomorrow. I will call him tomorrow to let him know one way or another.

## 2021-09-13 NOTE — Telephone Encounter (Signed)
I spoke with the patient and let him know to continue to ride around with his monitor until we can pick up something. The patient verbalized understanding.

## 2021-09-17 ENCOUNTER — Ambulatory Visit (INDEPENDENT_AMBULATORY_CARE_PROVIDER_SITE_OTHER): Payer: Medicare Other | Admitting: Internal Medicine

## 2021-09-17 ENCOUNTER — Other Ambulatory Visit: Payer: Self-pay

## 2021-09-17 DIAGNOSIS — Z79899 Other long term (current) drug therapy: Secondary | ICD-10-CM

## 2021-09-17 DIAGNOSIS — I493 Ventricular premature depolarization: Secondary | ICD-10-CM

## 2021-09-17 LAB — PULMONARY FUNCTION TEST
DL/VA % pred: 99 %
DL/VA: 3.92 ml/min/mmHg/L
DLCO cor % pred: 73 %
DLCO cor: 18.96 ml/min/mmHg
DLCO unc % pred: 73 %
DLCO unc: 18.96 ml/min/mmHg
FEF 25-75 Post: 1.64 L/sec
FEF 25-75 Pre: 0.95 L/sec
FEF2575-%Change-Post: 71 %
FEF2575-%Pred-Post: 68 %
FEF2575-%Pred-Pre: 40 %
FEV1-%Change-Post: 13 %
FEV1-%Pred-Post: 70 %
FEV1-%Pred-Pre: 62 %
FEV1-Post: 2.04 L
FEV1-Pre: 1.8 L
FEV1FVC-%Change-Post: -1 %
FEV1FVC-%Pred-Pre: 81 %
FEV6-%Change-Post: 13 %
FEV6-%Pred-Post: 87 %
FEV6-%Pred-Pre: 77 %
FEV6-Post: 3.25 L
FEV6-Pre: 2.86 L
FEV6FVC-%Change-Post: 0 %
FEV6FVC-%Pred-Post: 102 %
FEV6FVC-%Pred-Pre: 102 %
FVC-%Change-Post: 14 %
FVC-%Pred-Post: 86 %
FVC-%Pred-Pre: 75 %
FVC-Post: 3.37 L
FVC-Pre: 2.93 L
Post FEV1/FVC ratio: 61 %
Post FEV6/FVC ratio: 97 %
Pre FEV1/FVC ratio: 61 %
Pre FEV6/FVC Ratio: 98 %
RV % pred: 249 %
RV: 6.51 L
TLC % pred: 132 %
TLC: 9.57 L

## 2021-09-17 NOTE — Progress Notes (Signed)
Full PFT completed today ? ?

## 2021-09-18 ENCOUNTER — Telehealth: Payer: Self-pay

## 2021-09-18 NOTE — Telephone Encounter (Signed)
The patient states he went yesterday to do the breathing test and would like for Dr. Lovena Le nurse to call him when the results come in. I told him I will let her know.

## 2021-09-19 DIAGNOSIS — R1310 Dysphagia, unspecified: Secondary | ICD-10-CM | POA: Diagnosis not present

## 2021-09-19 DIAGNOSIS — K219 Gastro-esophageal reflux disease without esophagitis: Secondary | ICD-10-CM | POA: Diagnosis not present

## 2021-09-19 DIAGNOSIS — K649 Unspecified hemorrhoids: Secondary | ICD-10-CM | POA: Diagnosis not present

## 2021-09-19 DIAGNOSIS — K5900 Constipation, unspecified: Secondary | ICD-10-CM | POA: Diagnosis not present

## 2021-09-19 DIAGNOSIS — K5790 Diverticulosis of intestine, part unspecified, without perforation or abscess without bleeding: Secondary | ICD-10-CM | POA: Diagnosis not present

## 2021-10-04 DIAGNOSIS — K219 Gastro-esophageal reflux disease without esophagitis: Secondary | ICD-10-CM | POA: Diagnosis not present

## 2021-10-04 DIAGNOSIS — K7460 Unspecified cirrhosis of liver: Secondary | ICD-10-CM | POA: Diagnosis not present

## 2021-10-04 DIAGNOSIS — I2510 Atherosclerotic heart disease of native coronary artery without angina pectoris: Secondary | ICD-10-CM | POA: Diagnosis not present

## 2021-10-04 DIAGNOSIS — R1310 Dysphagia, unspecified: Secondary | ICD-10-CM | POA: Diagnosis not present

## 2021-10-04 DIAGNOSIS — F17210 Nicotine dependence, cigarettes, uncomplicated: Secondary | ICD-10-CM | POA: Diagnosis not present

## 2021-10-04 DIAGNOSIS — J449 Chronic obstructive pulmonary disease, unspecified: Secondary | ICD-10-CM | POA: Diagnosis not present

## 2021-10-04 DIAGNOSIS — Z79899 Other long term (current) drug therapy: Secondary | ICD-10-CM | POA: Diagnosis not present

## 2021-10-04 DIAGNOSIS — K222 Esophageal obstruction: Secondary | ICD-10-CM | POA: Diagnosis not present

## 2021-10-04 DIAGNOSIS — I129 Hypertensive chronic kidney disease with stage 1 through stage 4 chronic kidney disease, or unspecified chronic kidney disease: Secondary | ICD-10-CM | POA: Diagnosis not present

## 2021-10-04 DIAGNOSIS — N189 Chronic kidney disease, unspecified: Secondary | ICD-10-CM | POA: Diagnosis not present

## 2021-10-04 DIAGNOSIS — K449 Diaphragmatic hernia without obstruction or gangrene: Secondary | ICD-10-CM | POA: Diagnosis not present

## 2021-10-23 ENCOUNTER — Telehealth: Payer: Self-pay

## 2021-10-23 MED ORDER — AMIODARONE HCL 200 MG PO TABS: ORAL_TABLET | ORAL | 3 refills | Status: DC

## 2021-10-23 NOTE — Telephone Encounter (Signed)
Patient called in stating he needs refills on his amiodarone he states the pharmacy said he has no more refills. Patient would also like a call to make sure this has been sent

## 2021-10-23 NOTE — Telephone Encounter (Signed)
Returned call to Pt.  Advised amiodarone had been refilled.

## 2021-10-26 ENCOUNTER — Telehealth: Payer: Self-pay | Admitting: Cardiology

## 2021-10-26 MED ORDER — ROSUVASTATIN CALCIUM 20 MG PO TABS: 20.0000 mg | ORAL_TABLET | Freq: Every day | ORAL | 1 refills | Status: DC

## 2021-10-26 NOTE — Telephone Encounter (Signed)
°*  STAT* If patient is at the pharmacy, call can be transferred to refill team.   1. Which medications need to be refilled? (please list name of each medication and dose if known) rosuvastatin (CRESTOR) 20 MG tablet  2. Which pharmacy/location (including street and city if local pharmacy) is medication to be sent to? [facility]  3. Do they need a 30 day or 90 day supply? [place]

## 2021-10-26 NOTE — Telephone Encounter (Signed)
Please below info

## 2021-10-26 NOTE — Telephone Encounter (Signed)
Refill sent to pharmacy.   

## 2021-11-12 DIAGNOSIS — K5900 Constipation, unspecified: Secondary | ICD-10-CM | POA: Diagnosis not present

## 2021-11-12 DIAGNOSIS — K5790 Diverticulosis of intestine, part unspecified, without perforation or abscess without bleeding: Secondary | ICD-10-CM | POA: Diagnosis not present

## 2021-11-12 DIAGNOSIS — K219 Gastro-esophageal reflux disease without esophagitis: Secondary | ICD-10-CM | POA: Diagnosis not present

## 2021-11-12 DIAGNOSIS — R1310 Dysphagia, unspecified: Secondary | ICD-10-CM | POA: Diagnosis not present

## 2021-11-22 DIAGNOSIS — D2239 Melanocytic nevi of other parts of face: Secondary | ICD-10-CM | POA: Diagnosis not present

## 2021-11-22 DIAGNOSIS — L918 Other hypertrophic disorders of the skin: Secondary | ICD-10-CM | POA: Diagnosis not present

## 2021-11-26 ENCOUNTER — Ambulatory Visit (INDEPENDENT_AMBULATORY_CARE_PROVIDER_SITE_OTHER): Payer: Medicare Other

## 2021-11-26 DIAGNOSIS — I495 Sick sinus syndrome: Secondary | ICD-10-CM | POA: Diagnosis not present

## 2021-11-26 LAB — CUP PACEART REMOTE DEVICE CHECK
Date Time Interrogation Session: 20230220105035
Implantable Lead Implant Date: 20220221
Implantable Lead Implant Date: 20220221
Implantable Lead Location: 753859
Implantable Lead Location: 753860
Implantable Lead Model: 377171
Implantable Lead Model: 377171
Implantable Lead Serial Number: 8000149199
Implantable Lead Serial Number: 8000190251
Implantable Pulse Generator Implant Date: 20220221
Pulse Gen Model: 407145
Pulse Gen Serial Number: 70050696

## 2021-12-03 NOTE — Progress Notes (Signed)
Remote pacemaker transmission.   

## 2022-01-01 DIAGNOSIS — D485 Neoplasm of uncertain behavior of skin: Secondary | ICD-10-CM | POA: Diagnosis not present

## 2022-01-01 DIAGNOSIS — L918 Other hypertrophic disorders of the skin: Secondary | ICD-10-CM | POA: Diagnosis not present

## 2022-01-22 DIAGNOSIS — J302 Other seasonal allergic rhinitis: Secondary | ICD-10-CM | POA: Diagnosis not present

## 2022-01-22 DIAGNOSIS — H6123 Impacted cerumen, bilateral: Secondary | ICD-10-CM | POA: Diagnosis not present

## 2022-01-24 ENCOUNTER — Telehealth: Payer: Self-pay

## 2022-01-24 NOTE — Telephone Encounter (Signed)
I spoke with the patient and his daughter do not get home until tonight. I told him they can send the picture when she get home. The nurse will look at it in the morning and give him a call. The patient verbalized understanding. ?

## 2022-01-24 NOTE — Telephone Encounter (Signed)
The patient states he has a bump where his pacemaker is and it itches. I asked the patient to take a picture of the bump and send it to my cone email address. I told him once I get the picture, I will have the nurse to give him a call back. ?

## 2022-01-29 NOTE — Telephone Encounter (Signed)
Pt asked me to give his daughter a call to get his picture.  ? ?I called her but no answer/no voicemail. Her phone number is [phone]. The patient forgot to give me his daughter name. ?

## 2022-01-30 NOTE — Telephone Encounter (Signed)
I spoke with the pt daughter and received his picture. I forward the picture to Lavenia Atlas.  ?

## 2022-01-30 NOTE — Telephone Encounter (Signed)
Biotronik PPM placed 11/27/20. Any recommendations Dr. Lovena Le? ? ? ?

## 2022-01-31 NOTE — Telephone Encounter (Signed)
Looks like a small keloid. Watchful waiting. ?

## 2022-02-01 NOTE — Telephone Encounter (Signed)
Spoke with patient informed him that Dr. Lovena Le recommended watchful waiting as it is likely a keloid informed patient that he could start with his PCP or a dermatologist reassured patient that the incision site doesn't look infected. Patient voiced understanding  ?

## 2022-02-01 NOTE — Telephone Encounter (Signed)
The patient called to see if we got his picture. I let him know we did get the picture and sent it to the nurse. He would like for the nurse to give him a call back to discuss. He states it itches a lot. ?

## 2022-04-18 ENCOUNTER — Ambulatory Visit (INDEPENDENT_AMBULATORY_CARE_PROVIDER_SITE_OTHER): Payer: Medicare Other

## 2022-04-18 DIAGNOSIS — I495 Sick sinus syndrome: Secondary | ICD-10-CM

## 2022-04-19 LAB — CUP PACEART INCLINIC DEVICE CHECK
Date Time Interrogation Session: 20230714132801
Implantable Lead Implant Date: 20220221
Implantable Lead Implant Date: 20220221
Implantable Lead Location: 753859
Implantable Lead Location: 753860
Implantable Lead Model: 377171
Implantable Lead Model: 377171
Implantable Lead Serial Number: 8000149199
Implantable Lead Serial Number: 8000190251
Implantable Pulse Generator Implant Date: 20220221
Pulse Gen Model: 407145
Pulse Gen Serial Number: 70050696

## 2022-04-19 NOTE — Progress Notes (Signed)
Pacemaker check in clinic. Normal device function. Thresholds, sensing, impedances consistent with previous measurements. Device programmed to maximize longevity. No mode switches. 3 HVR events logged, 2 appear NSVT with <20 beats in duration. 1 Appears (1:1) with duratoin of 6 seconds.  Device programmed at appropriate safety margins. Histogram distribution appropriate for patient activity level. Device programmed to optimize intrinsic conduction. Estimated longevity 7 years 4 minths. Patient does in-clinic checks. Patient education completed.

## 2022-05-20 ENCOUNTER — Other Ambulatory Visit: Payer: Self-pay | Admitting: Cardiology

## 2022-06-06 ENCOUNTER — Other Ambulatory Visit: Payer: Self-pay | Admitting: Internal Medicine

## 2022-06-06 DIAGNOSIS — B351 Tinea unguium: Secondary | ICD-10-CM | POA: Diagnosis not present

## 2022-06-06 DIAGNOSIS — L851 Acquired keratosis [keratoderma] palmaris et plantaris: Secondary | ICD-10-CM | POA: Diagnosis not present

## 2022-06-06 DIAGNOSIS — M792 Neuralgia and neuritis, unspecified: Secondary | ICD-10-CM | POA: Diagnosis not present

## 2022-06-27 DIAGNOSIS — Z79899 Other long term (current) drug therapy: Secondary | ICD-10-CM | POA: Diagnosis not present

## 2022-06-27 DIAGNOSIS — I10 Essential (primary) hypertension: Secondary | ICD-10-CM | POA: Diagnosis not present

## 2022-06-27 DIAGNOSIS — M461 Sacroiliitis, not elsewhere classified: Secondary | ICD-10-CM | POA: Diagnosis not present

## 2022-06-27 DIAGNOSIS — M160 Bilateral primary osteoarthritis of hip: Secondary | ICD-10-CM | POA: Diagnosis not present

## 2022-06-27 DIAGNOSIS — Z7951 Long term (current) use of inhaled steroids: Secondary | ICD-10-CM | POA: Diagnosis not present

## 2022-06-27 DIAGNOSIS — S300XXA Contusion of lower back and pelvis, initial encounter: Secondary | ICD-10-CM | POA: Diagnosis not present

## 2022-06-27 DIAGNOSIS — M545 Low back pain, unspecified: Secondary | ICD-10-CM | POA: Diagnosis not present

## 2022-06-27 DIAGNOSIS — J449 Chronic obstructive pulmonary disease, unspecified: Secondary | ICD-10-CM | POA: Diagnosis not present

## 2022-06-27 DIAGNOSIS — M47816 Spondylosis without myelopathy or radiculopathy, lumbar region: Secondary | ICD-10-CM | POA: Diagnosis not present

## 2022-06-27 DIAGNOSIS — Z87891 Personal history of nicotine dependence: Secondary | ICD-10-CM | POA: Diagnosis not present

## 2022-07-03 ENCOUNTER — Other Ambulatory Visit: Payer: Self-pay | Admitting: Cardiology

## 2022-07-03 DIAGNOSIS — M545 Low back pain, unspecified: Secondary | ICD-10-CM | POA: Diagnosis not present

## 2022-07-03 DIAGNOSIS — N182 Chronic kidney disease, stage 2 (mild): Secondary | ICD-10-CM | POA: Diagnosis not present

## 2022-07-03 DIAGNOSIS — R42 Dizziness and giddiness: Secondary | ICD-10-CM | POA: Diagnosis not present

## 2022-07-31 ENCOUNTER — Other Ambulatory Visit: Payer: Self-pay | Admitting: Cardiology

## 2022-09-05 DIAGNOSIS — B351 Tinea unguium: Secondary | ICD-10-CM | POA: Diagnosis not present

## 2022-09-05 DIAGNOSIS — L851 Acquired keratosis [keratoderma] palmaris et plantaris: Secondary | ICD-10-CM | POA: Diagnosis not present

## 2022-09-10 ENCOUNTER — Other Ambulatory Visit: Payer: Self-pay

## 2022-09-10 MED ORDER — AMLODIPINE BESYLATE 10 MG PO TABS: 10.0000 mg | ORAL_TABLET | Freq: Every day | ORAL | 1 refills | Status: DC

## 2022-09-15 NOTE — Progress Notes (Signed)
Cardiology Office Note Date:  09/15/2022  Patient ID:  Luis Hood, Luis Hood November 23, 1945, MRN [id number] PCP:  Crist Fat, MD  Cardiologist:  Dr.Tobb Electrophysiologist: Dr. Ladona Ridgel    Chief Complaint:  annual visit  History of Present Illness: Luis Hood is a 76 y.o. male with history of HTN, PVCs, AT, sinus node dysfunction w/PPM  He comes in today to be seen for Dr. Ladona Ridgel, last seen by him May 2022, at that time doing well, discussed his cough was initially worrisome given his amiodarone though resolved off ACE, was doing well. No changes were made, maintaining SR.  Phone notes report the patient's PMD office called that the patient was suffering cough syncope and wanted his pacer checked.  Attempts to get remote sent was unsuccessful and patient given an appt and instructed to bring his home transmitter.  I saw him 08/23/21 He reports he does not think he is fainting but when he gets into the coughing fits that can be so bad he "kind of shakes all over" "not a seizure, but like it" He does not collapse and does not think he I fainting. His cough is provoked he says when eating, but mostly drinking some goes down the airway and causes him to cough. Says he has a chronic irritation/phlegm at the back ohis throat and makes him cough, he thinks might be post nasal gtt, he isn't sure. He does not feel like his cough is driven by his lungs Denies ant kind of SOB at rest or exertional No CP, palpitations or cardiac awareness N on ear syncope or syncope when not coughing. He says he has been referred to ENT and sees them next month. Did not think his cough was amio related, but planned for PFTs No changes were made otherwise and planned for labs  PFT consistent with emphysema and recommended referral to Pulmonology  April 2023 pt called with concerns of his pacer site, sent a photo in, suspect to be a developing keloid, to keep an eye on it   TODAY He feels very well. No  CP, palpitations or cardiac awareness. No SOB, he does have a bit of a wheeze chronically, has an inhaler via his PMD that helps but infrequently needs it. Has been very active this fall cutting wood for fire wood. Denies any kind of exertional intolerances. No dizzy spells, near syncope or syncope.  He did not see pulmonology, was unaware of the recommendation, but his PMD manages his inhaler and denies any kind of SOB, DOE.    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  AAD hx amiodarone started march 2021 for PVCs, NSVT   Past Medical History:  Diagnosis Date   Chest pain 10/20/2019   Corn of toe 05/13/2016   COVID-19 10/06/2019   Dyslipidemia 10/20/2019   Dyspnea on exertion 10/20/2019   Essential hypertension 12/20/2014   Hammer toes of both feet 05/13/2016   Tinea pedis of both feet 05/13/2016    Past Surgical History:  Procedure Laterality Date   CATARACT EXTRACTION Bilateral    HERNIA REPAIR     LEFT HEART CATH AND CORONARY ANGIOGRAPHY N/A 10/28/2019   Procedure: LEFT HEART CATH AND CORONARY ANGIOGRAPHY;  Surgeon: Marykay Lex, MD;  Location: [facility];  Service: Cardiovascular;  Laterality: N/A;   PACEMAKER IMPLANT N/A 11/27/2020   Procedure: PACEMAKER IMPLANT;  Surgeon: Marinus Maw, MD;  Location: [facility];  Service: Cardiovascular;  Laterality: N/A;   REPLACEMENT TOTAL  KNEE Bilateral    TONSILLECTOMY      Current Outpatient Medications  Medication Sig Dispense Refill   acetaminophen (TYLENOL) 500 MG tablet Take 1,000 mg by mouth every 6 (six) hours as needed for moderate pain or headache.     albuterol (VENTOLIN HFA) 108 (90 Base) MCG/ACT inhaler Inhale 1-2 puffs into the lungs every 6 (six) hours as needed for wheezing or shortness of breath.      amiodarone (PACERONE) 200 MG tablet TAKE ONE TABLET BY MOUTH EVERY DAY EXCEPT SATURDAY AND SUNDAY 90 tablet 3   amLODipine (NORVASC) 10 MG tablet Take 1 tablet (10 mg total) by  mouth daily. 90 tablet 1   FARXIGA 10 MG TABS tablet Take 10 mg by mouth every morning.     FLOVENT HFA 110 MCG/ACT inhaler Inhale 2 puffs into the lungs as needed.     hydrochlorothiazide (HYDRODIURIL) 25 MG tablet TAKE 1 TABLET ONCE DAILY 90 tablet 1   Multiple Vitamin (MULTIVITAMIN WITH MINERALS) TABS tablet Take 1 tablet by mouth daily.     Naphazoline-Glycerin (REDNESS RELIEF OP) Place 1 drop into both eyes daily as needed (redness/ irritation).     nitroGLYCERIN (NITROSTAT) 0.4 MG SL tablet Place 0.4 mg under the tongue every 5 (five) minutes as needed for chest pain.     Omega-3 Fatty Acids (FISH OIL) 1000 MG CAPS Take 1,000 mg by mouth daily.     omeprazole (PRILOSEC) 20 MG capsule Take 20 mg by mouth daily.     rosuvastatin (CRESTOR) 20 MG tablet Take 1 tablet (20 mg total) by mouth daily. Please contact the office to schedule appointment for additional refills. 2ed Attempt. 15 tablet 0   No current facility-administered medications for this visit.    Allergies:   Patient has no known allergies.   Social History:  The patient  reports that he quit smoking about 3 years ago. His smoking use included cigarettes. He has never used smokeless tobacco. He reports current alcohol use of about 2.0 standard drinks of alcohol per week. He reports current drug use. Drug: Marijuana.   Family History:  The patient's family history includes Seizures in his mother.  ROS:  Please see the history of present illness.    All other systems are reviewed and otherwise negative.   PHYSICAL EXAM:  VS:  There were no vitals taken for this visit. BMI: There is no height or weight on file to calculate BMI. Well nourished, well developed, in no acute distress HEENT: normocephalic, atraumatic Neck: no JVD, carotid bruits or masses Cardiac:  RRR; no significant murmurs, no rubs, or gallops Lungs:  CTA b/l, very soft exp wheez b/l bases, no rhonchi or rales Abd: soft, nontender MS: no deformity or  atrophy Ext: no edema Skin: warm and dry, no rash Neuro:  No gross deficits appreciated Psych: euthymic mood, full affect  PPM site is stable, no tethering or discomfort, small keloid noted   EKG:  Done today and reviewed by myself shows  AP/VS 81bpm, RBBB   Device interrogation done today by industry and reviewed by myself:  Battery and lead measurements are good 2 NSVTs, one PAT  09/17/21: PFT Conclusions: A reduced diffusing capacity, moderate airway obstruction and overinflation are characteristic of emphysema. The response to bronchodilators indicates a reversible component. Pulmonary Function Diagnosis: Moderate Obstructive Airways Disease -Emphysematous Type, Reversible Component Overinflation and airtrapping Significant response to bronchodilator Diffusion mildly reduced   10/28/2019: LHC There is mild left ventricular systolic dysfunction. The left  ventricular ejection fraction is 45-50% by visual estimate. LV end diastolic pressure is mildly elevated. In the LV appears to be somewhat dilated with thickened myocardium. 1st Mrg lesion is 55% stenosed. Prox LAD to Mid LAD lesion is 20% stenosed.   SUMMARY Angiographically moderate single-vessel disease with a moderate caliber OM1 having 50% stenosis, otherwise angiographically normal coronary arteries. Globular dilated left ventricle but with actually relatively preserved EF.  Difficult to really assess true wall motion normality.  Recommend 2D echo and consider cardiac MRI given the suspicion of significant LVH..    10/05/2019: TTE LVEF 50-55% WMA c/w BBB    Recent Labs: No results found for requested labs within last 365 days.  No results found for requested labs within last 365 days.   CrCl cannot be calculated (Patient's most recent lab result is older than the maximum 21 days allowed.).   Wt Readings from Last 3 Encounters:  08/23/21 215 lb 12.8 oz (97.9 kg)  02/27/21 212 lb 3.2 oz (96.3 kg)  11/27/20  210 lb (95.3 kg)     Other studies reviewed: Additional studies/records reviewed today include: summarized above  ASSESSMENT AND PLAN:  PPM Intact function No programming changes made  Unable to do remotes where he lives, planned for in clinic checks   PVCs, NSVT AT No AFib/flutter chronic amiodarone Update labs    Disposition: seeing his PMD annually, maybe not quite that often, but gets labs there, urged to f/u regularly.  See him in clinic in 42mo, sooner if needed   Current medicines are reviewed at length with the patient today.  The patient did not have any concerns regarding medicines.  Norma Fredrickson, PA-C 09/15/2022 12:57 PM     [facility] [address] [place] [address] [phone] (office)  [phone] (fax)

## 2022-09-16 ENCOUNTER — Other Ambulatory Visit: Payer: Self-pay

## 2022-09-16 MED ORDER — FLOVENT HFA 110 MCG/ACT IN AERO: 2.0000 | INHALATION_SPRAY | RESPIRATORY_TRACT | 3 refills | Status: DC | PRN

## 2022-09-17 ENCOUNTER — Ambulatory Visit: Payer: Medicare Other | Attending: Physician Assistant | Admitting: Physician Assistant

## 2022-09-17 ENCOUNTER — Encounter: Payer: Self-pay | Admitting: Physician Assistant

## 2022-09-17 VITALS — BP 124/68 | HR 81 | Ht 71.0 in | Wt 204.0 lb

## 2022-09-17 DIAGNOSIS — I493 Ventricular premature depolarization: Secondary | ICD-10-CM

## 2022-09-17 DIAGNOSIS — Z9229 Personal history of other drug therapy: Secondary | ICD-10-CM | POA: Diagnosis not present

## 2022-09-17 DIAGNOSIS — Z950 Presence of cardiac pacemaker: Secondary | ICD-10-CM | POA: Diagnosis not present

## 2022-09-17 DIAGNOSIS — I4719 Other supraventricular tachycardia: Secondary | ICD-10-CM

## 2022-09-17 DIAGNOSIS — I4729 Other ventricular tachycardia: Secondary | ICD-10-CM | POA: Diagnosis not present

## 2022-09-17 DIAGNOSIS — Z79899 Other long term (current) drug therapy: Secondary | ICD-10-CM | POA: Diagnosis not present

## 2022-09-17 LAB — CUP PACEART INCLINIC DEVICE CHECK
Date Time Interrogation Session: 20231212133818
Implantable Lead Connection Status: 753985
Implantable Lead Connection Status: 753985
Implantable Lead Implant Date: 20220221
Implantable Lead Implant Date: 20220221
Implantable Lead Location: 753859
Implantable Lead Location: 753860
Implantable Lead Model: 377171
Implantable Lead Model: 377171
Implantable Lead Serial Number: 8000149199
Implantable Lead Serial Number: 8000190251
Implantable Pulse Generator Implant Date: 20220221
Lead Channel Pacing Threshold Amplitude: 1 V
Lead Channel Pacing Threshold Amplitude: 1.2 V
Lead Channel Pacing Threshold Pulse Width: 0.4 ms
Lead Channel Pacing Threshold Pulse Width: 0.4 ms
Lead Channel Sensing Intrinsic Amplitude: 7 mV
Lead Channel Sensing Intrinsic Amplitude: 9.8 mV
Pulse Gen Model: 407145
Pulse Gen Serial Number: 70050696

## 2022-09-17 NOTE — Patient Instructions (Signed)
Medication Instructions:    Your physician recommends that you continue on your current medications as directed. Please refer to the Current Medication list given to you today.  *If you need a refill on your cardiac medications before your next appointment, please call your pharmacy*   Lab Work:  TODAY :  CMET MAG TSH AND FREE T4   If you have labs (blood work) drawn today and your tests are completely normal, you will receive your results only by: [place] (if you have MyChart) OR A paper copy in the mail If you have any lab test that is abnormal or we need to change your treatment, we will call you to review the results.   Testing/Procedures: NONE ORDERED  TODAY     Follow-Up: At [facility], you and your health needs are our priority.  As part of our continuing mission to provide you with exceptional heart care, we have created designated Provider Care Teams.  These Care Teams include your primary Cardiologist (physician) and Advanced Practice Providers (APPs -  Physician Assistants and Nurse Practitioners) who all work together to provide you with the care you need, when you need it.  We recommend signing up for the patient portal called "MyChart".  Sign up information is provided on this After Visit Summary.  MyChart is used to connect with patients for Virtual Visits (Telemedicine).  Patients are able to view lab/test results, encounter notes, upcoming appointments, etc.  Non-urgent messages can be sent to your provider as well.   To learn more about what you can do with MyChart, go to [email or web address].    Your next appointment:   6 month(s)  The format for your next appointment:   In Person  Provider:   You may see Cristopher Peru, MD or one of the following Advanced Practice Providers on your designated Care Team:   Tommye Standard, [place]  Other Instructions   Important Information About Sugar

## 2022-09-18 LAB — COMPREHENSIVE METABOLIC PANEL
ALT: 17 IU/L (ref 0–44)
AST: 24 IU/L (ref 0–40)
Albumin/Globulin Ratio: 1.6 (ref 1.2–2.2)
Albumin: 4.3 g/dL (ref 3.8–4.8)
Alkaline Phosphatase: 72 IU/L (ref 44–121)
BUN/Creatinine Ratio: 14 (ref 10–24)
BUN: 21 mg/dL (ref 8–27)
Bilirubin Total: 0.4 mg/dL (ref 0.0–1.2)
CO2: 26 mmol/L (ref 20–29)
Calcium: 9.4 mg/dL (ref 8.6–10.2)
Chloride: 101 mmol/L (ref 96–106)
Creatinine, Ser: 1.48 mg/dL — ABNORMAL HIGH (ref 0.76–1.27)
Globulin, Total: 2.7 g/dL (ref 1.5–4.5)
Glucose: 104 mg/dL — ABNORMAL HIGH (ref 70–99)
Potassium: 4.5 mmol/L (ref 3.5–5.2)
Sodium: 140 mmol/L (ref 134–144)
Total Protein: 7 g/dL (ref 6.0–8.5)
eGFR: 49 mL/min/{1.73_m2} — ABNORMAL LOW (ref >59)

## 2022-09-18 LAB — T4, FREE: Free T4: 1.74 ng/dL (ref 0.82–1.77)

## 2022-09-18 LAB — MAGNESIUM: Magnesium: 2.2 mg/dL (ref 1.6–2.3)

## 2022-09-18 LAB — TSH: TSH: 1.16 u[IU]/mL (ref 0.450–4.500)

## 2022-10-04 ENCOUNTER — Other Ambulatory Visit: Payer: Self-pay

## 2022-10-04 MED ORDER — OMEPRAZOLE 20 MG PO CPDR: 40.0000 mg | DELAYED_RELEASE_CAPSULE | Freq: Every day | ORAL | 1 refills | Status: DC

## 2022-11-14 IMAGING — CR DG CHEST 2V
2 series · 2 of 2 positions shown · non-contrast
Comparison: Radiographs of the chest 10/05/2019.

CLINICAL DATA: Pacemaker.

EXAM:
CHEST - 2 VIEW

[w chest pa]
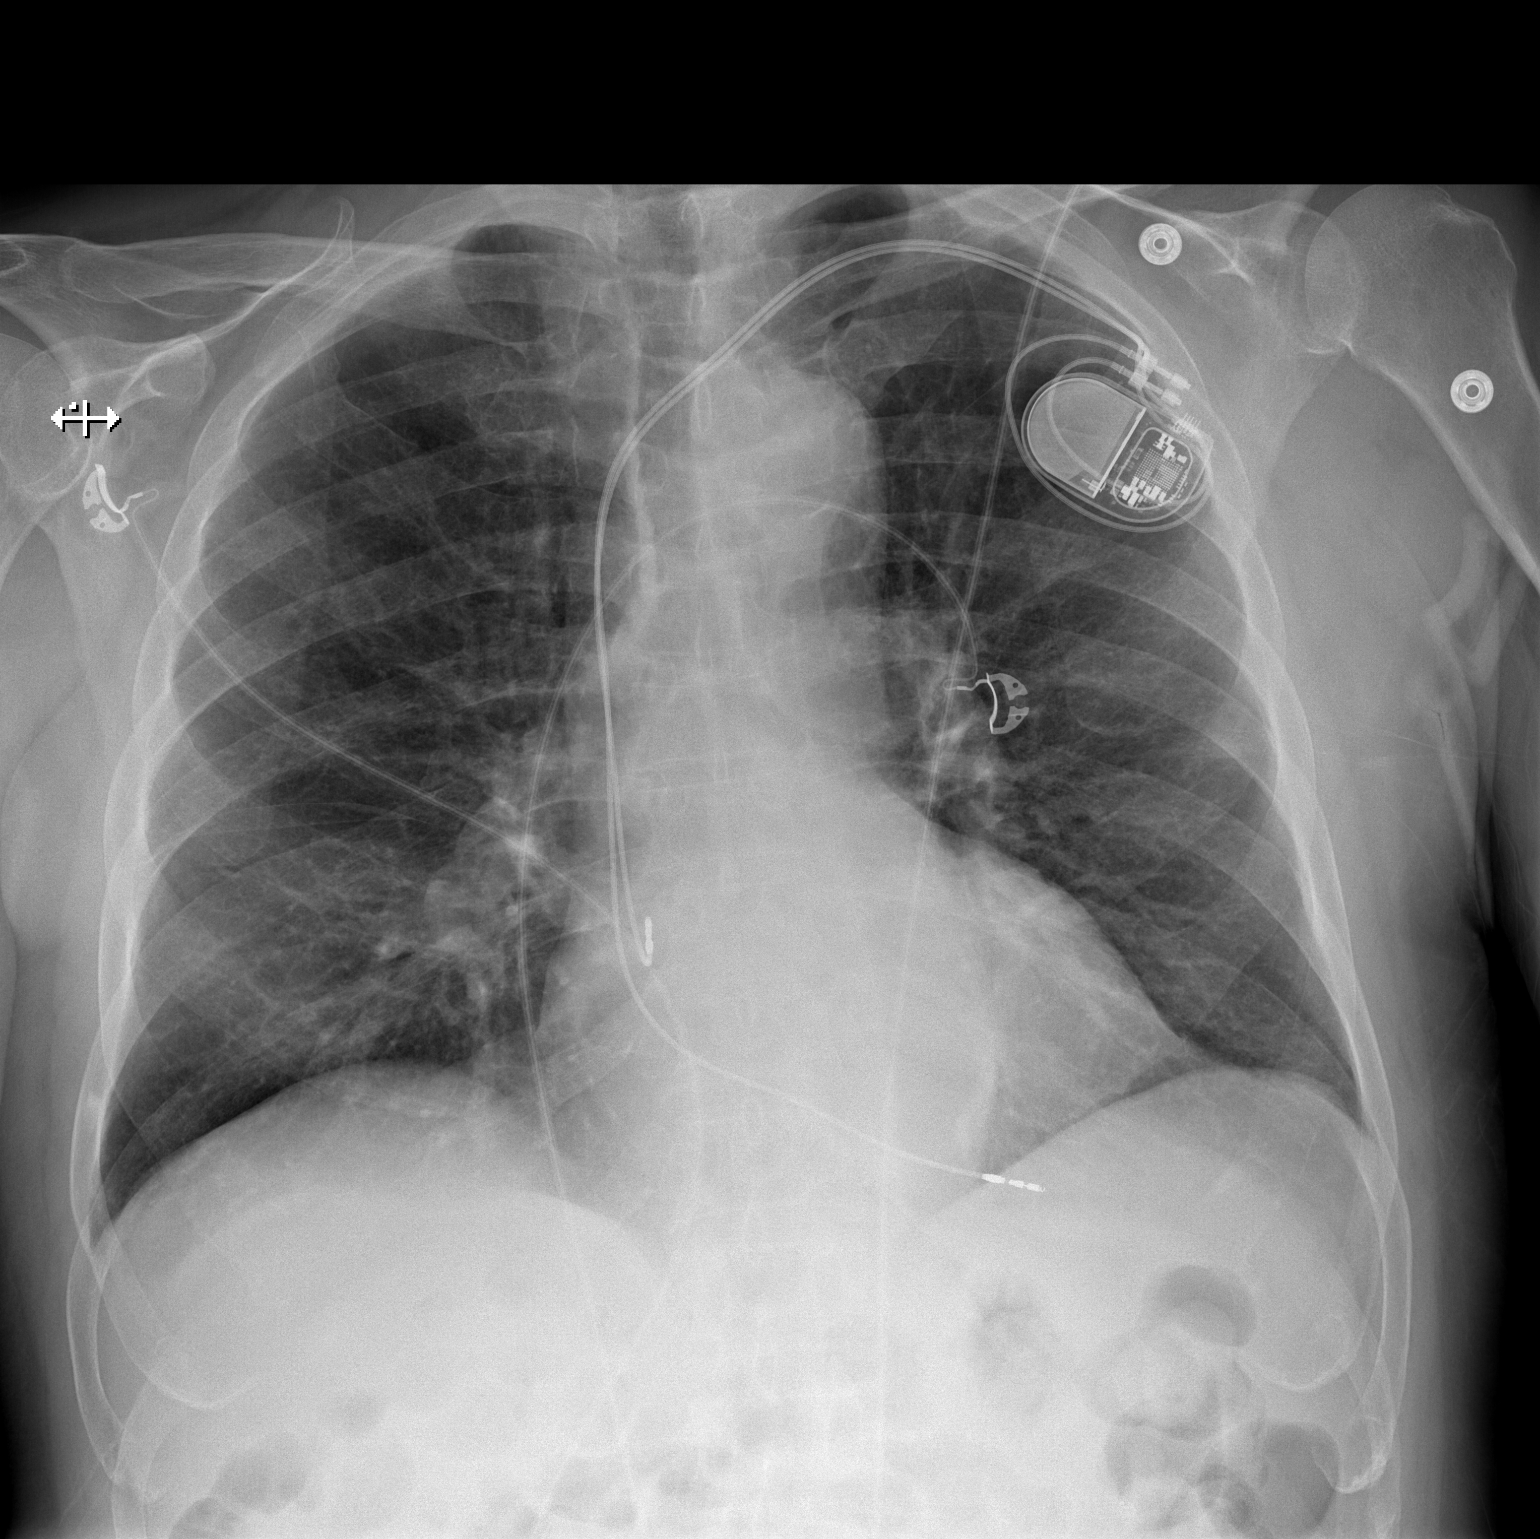

[w chest lat]
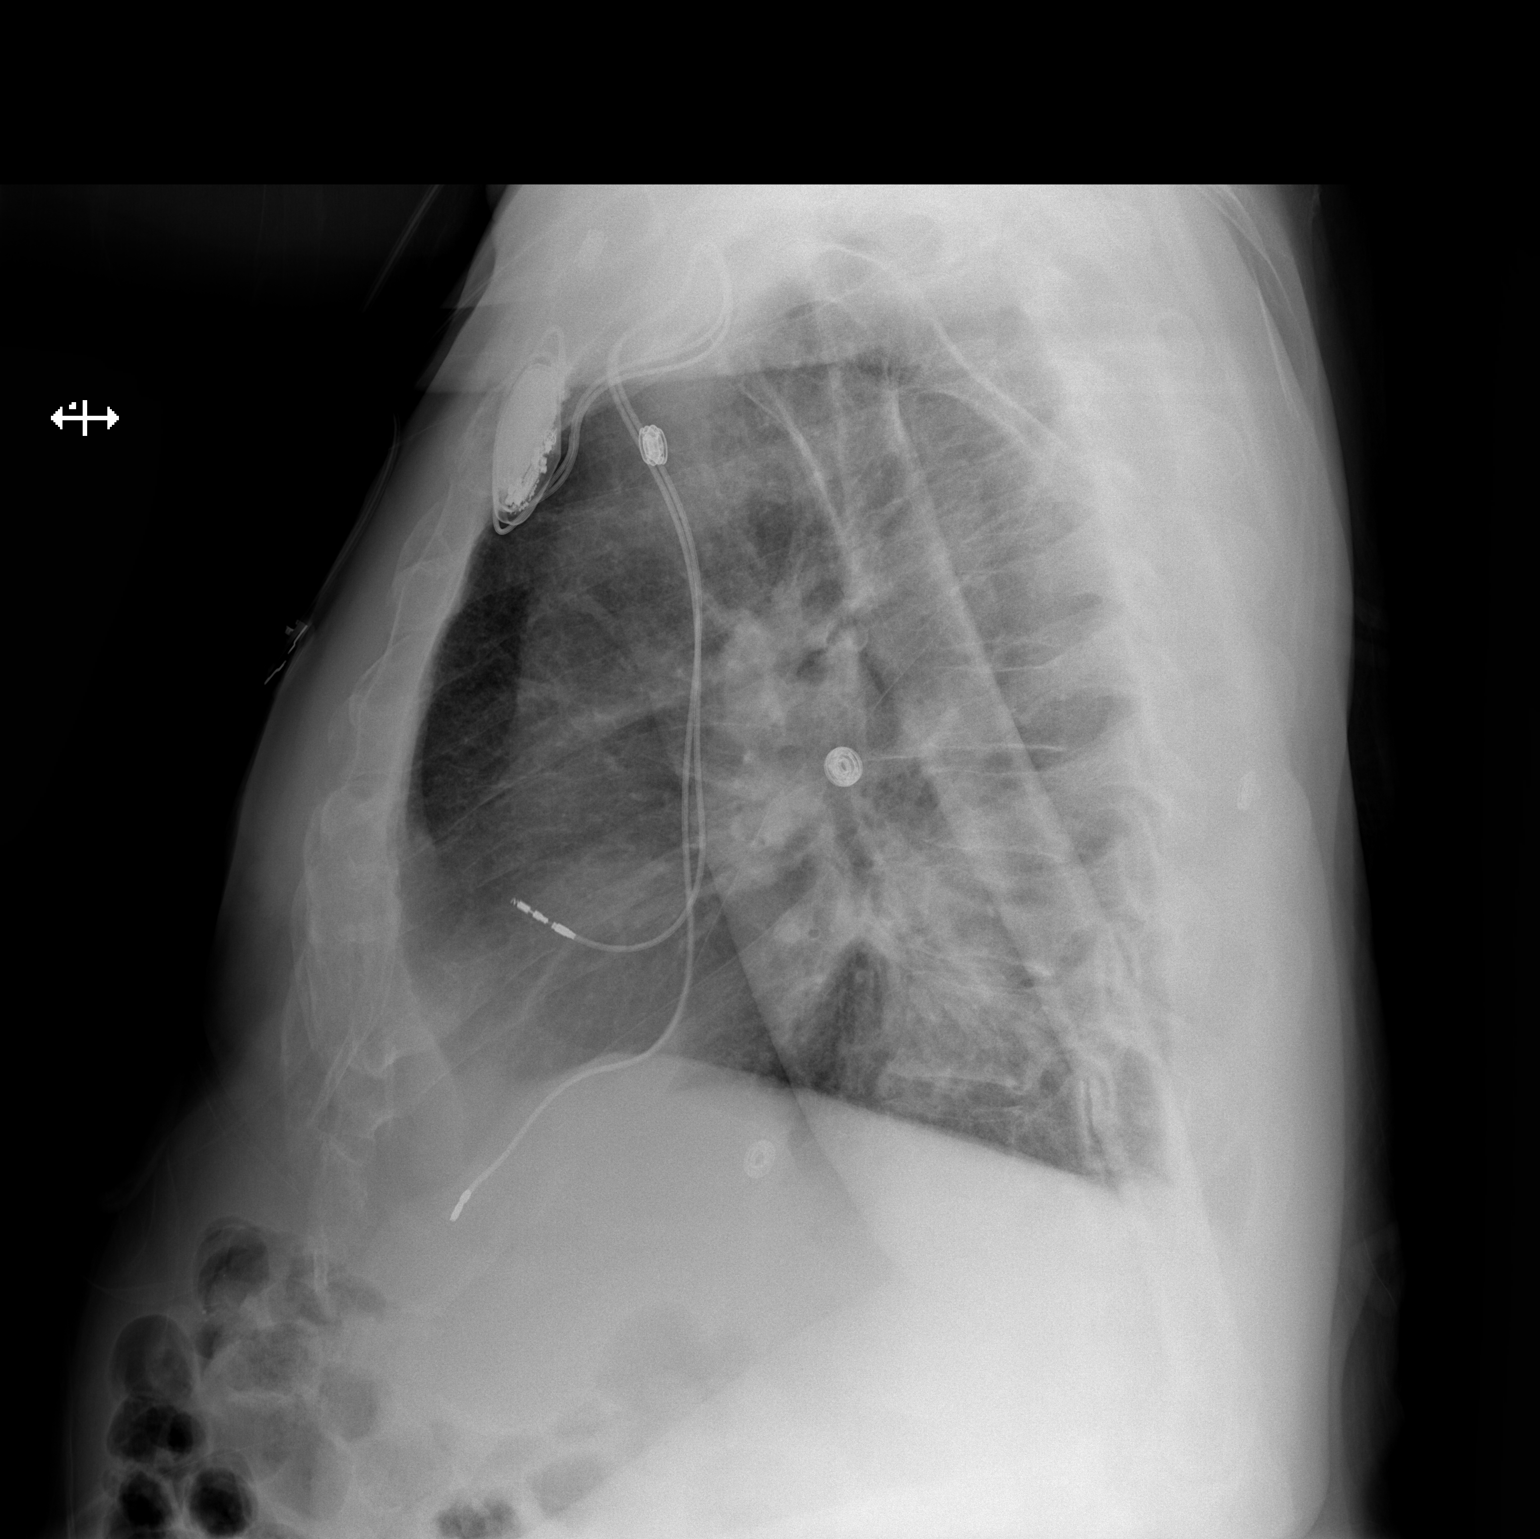

[2 of 2 positions shown; findings below may reference images not displayed]

FINDINGS: Dual lead pacemaker device with leads projecting over the right
atrium and right ventricle. Unchanged cardiomegaly. No appreciable
airspace consolidation or pulmonary edema. No evidence of pleural
effusion or pneumothorax. No acute bony abnormality identified.
IMPRESSION: Interval pacemaker placement.

No evidence of acute cardiopulmonary abnormality.

Unchanged cardiomegaly.

## 2022-11-25 ENCOUNTER — Other Ambulatory Visit: Payer: Self-pay | Admitting: Internal Medicine

## 2022-12-10 ENCOUNTER — Other Ambulatory Visit: Payer: Self-pay

## 2022-12-10 MED ORDER — AMIODARONE HCL 200 MG PO TABS: ORAL_TABLET | ORAL | 2 refills | Status: DC

## 2022-12-10 MED ORDER — HYDROCHLOROTHIAZIDE 25 MG PO TABS: 25.0000 mg | ORAL_TABLET | Freq: Every day | ORAL | 2 refills | Status: DC

## 2023-01-11 ENCOUNTER — Other Ambulatory Visit: Payer: Self-pay | Admitting: Internal Medicine

## 2023-03-05 ENCOUNTER — Other Ambulatory Visit: Payer: Self-pay | Admitting: Internal Medicine

## 2023-03-18 ENCOUNTER — Ambulatory Visit: Payer: 59 | Attending: Internal Medicine | Admitting: Internal Medicine

## 2023-03-18 ENCOUNTER — Encounter: Payer: Self-pay | Admitting: Internal Medicine

## 2023-03-18 VITALS — BP 124/72 | HR 67 | Ht 71.0 in | Wt 208.4 lb

## 2023-03-18 DIAGNOSIS — Z950 Presence of cardiac pacemaker: Secondary | ICD-10-CM | POA: Diagnosis not present

## 2023-03-18 DIAGNOSIS — I10 Essential (primary) hypertension: Secondary | ICD-10-CM | POA: Diagnosis not present

## 2023-03-18 DIAGNOSIS — I495 Sick sinus syndrome: Secondary | ICD-10-CM | POA: Diagnosis not present

## 2023-03-18 LAB — CUP PACEART INCLINIC DEVICE CHECK
Date Time Interrogation Session: 20240611092434
Implantable Lead Connection Status: 753985
Implantable Lead Connection Status: 753985
Implantable Lead Implant Date: 20220221
Implantable Lead Implant Date: 20220221
Implantable Lead Location: 753859
Implantable Lead Location: 753860
Implantable Lead Model: 377171
Implantable Lead Model: 377171
Implantable Lead Serial Number: 8000149199
Implantable Lead Serial Number: 8000190251
Implantable Pulse Generator Implant Date: 20220221
Pulse Gen Model: 407145
Pulse Gen Serial Number: 70050696

## 2023-03-18 NOTE — Patient Instructions (Signed)
Medication Instructions:  Your physician recommends that you continue on your current medications as directed. Please refer to the Current Medication list given to you today.  *If you need a refill on your cardiac medications before your next appointment, please call your pharmacy*  Lab Work: None ordered.  If you have labs (blood work) drawn today and your tests are completely normal, you will receive your results only by: MyChart Message (if you have MyChart) OR A paper copy in the mail If you have any lab test that is abnormal or we need to change your treatment, we will call you to review the results.  Testing/Procedures: None ordered.  Follow-Up: At [facility], you and your health needs are our priority.  As part of our continuing mission to provide you with exceptional heart care, we have created designated Provider Care Teams.  These Care Teams include your primary Cardiologist (physician) and Advanced Practice Providers (APPs -  Physician Assistants and Nurse Practitioners) who all work together to provide you with the care you need, when you need it.   Your next appointment:   1 year(s)  The format for your next appointment:   In Person  Provider:   Gregg Taylor, MD{or one of the following Advanced Practice Providers on your designated Care Team:   Renee Ursuy, PA-C Michael "Andy" Tillery, PA-C           

## 2023-03-18 NOTE — Progress Notes (Signed)
HPI Mr. Gaschler returns today for followup of his AT, PVC's and LV dysfunction. He is a pleasant 77 yo man with HTN who has been treated for PVC's with amiodarone. He initially had some cough and his ACE inhibitor was stopped. His cough resolved. He also has sinus node dysfunction and underwent PPM insertion over 2 years ago. 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 Mild peripheral edema on norvasc. He denies chest pain or sob.  No Known Allergies   Current Outpatient Medications  Medication Sig Dispense Refill   acetaminophen (TYLENOL) 500 MG tablet Take 1,000 mg by mouth every 6 (six) hours as needed for moderate pain or headache.     albuterol (VENTOLIN HFA) 108 (90 Base) MCG/ACT inhaler Inhale 1-2 puffs into the lungs every 6 (six) hours as needed for wheezing or shortness of breath.      amiodarone (PACERONE) 200 MG tablet TAKE ONE TABLET BY MOUTH EVERY DAY EXCEPT SATURDAY AND SUNDAY 90 tablet 2   amLODipine (NORVASC) 10 MG tablet TAKE ONE TABLET BY MOUTH EVERY DAY 30 tablet 0   FARXIGA 10 MG TABS tablet TAKE 1 TABLET BY MOUTH EVERY MORNING 90 tablet 1   FLOVENT HFA 110 MCG/ACT inhaler Inhale 2 puffs into the lungs as needed. 1 each 3   hydrochlorothiazide (HYDRODIURIL) 25 MG tablet Take 1 tablet (25 mg total) by mouth daily. 90 tablet 2   Multiple Vitamin (MULTIVITAMIN WITH MINERALS) TABS tablet Take 1 tablet by mouth daily.     Naphazoline-Glycerin (REDNESS RELIEF OP) Place 1 drop into both eyes daily as needed (redness/ irritation).     nitroGLYCERIN (NITROSTAT) 0.4 MG SL tablet Place 0.4 mg under the tongue every 5 (five) minutes as needed for chest pain.     Omega-3 Fatty Acids (FISH OIL) 1000 MG CAPS Take 1,000 mg by mouth daily.     omeprazole (PRILOSEC) 20 MG capsule TAKE 2 CAPSULES BY MOUTH DAILY 90 capsule 1   rosuvastatin (CRESTOR) 20 MG tablet Take 1 tablet (20 mg total) by mouth daily. Please contact  the office to schedule appointment for additional refills. 2ed Attempt. 15 tablet 0   No current facility-administered medications for this visit.     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   Past Surgical History:  Procedure Laterality Date   CATARACT EXTRACTION Bilateral    HERNIA REPAIR     LEFT HEART CATH AND CORONARY ANGIOGRAPHY N/A 10/28/2019   Procedure: LEFT HEART CATH AND CORONARY ANGIOGRAPHY;  Surgeon: Marykay Lex, MD;  Location: [facility] INVASIVE CV LAB;  Service: Cardiovascular;  Laterality: N/A;   PACEMAKER IMPLANT N/A 11/27/2020   Procedure: PACEMAKER IMPLANT;  Surgeon: Marinus Maw, MD;  Location: [facility] INVASIVE CV LAB;  Service: Cardiovascular;  Laterality: N/A;   REPLACEMENT TOTAL KNEE Bilateral    TONSILLECTOMY       Family History  Problem Relation Age of Onset   Seizures Mother      Social History   Socioeconomic History   Marital status: Widowed  Spouse name: Not on file   Number of children: Not on file   Years of education: Not on file   Highest education level: Not on file  Occupational History   Not on file  Tobacco Use   Smoking status: Former    Types: Cigarettes    Quit date: 2020    Years since quitting: 4.4   Smokeless tobacco: Never  Vaping Use   Vaping Use: Never used  Substance and Sexual Activity   Alcohol use: Yes    Alcohol/week: 2.0 standard drinks of alcohol    Types: 2 Shots of liquor per week   Drug use: Yes    Types: Marijuana   Sexual activity: Not on file  Other Topics Concern   Not on file  Social History Narrative   ** Merged History Encounter **       Social Determinants of Health   Financial Resource  Strain: Not on file  Food Insecurity: Not on file  Transportation Needs: Not on file  Physical Activity: Not on file  Stress: Not on file  Social Connections: Not on file  Intimate Partner Violence: Not on file     BP 124/72   Pulse 67   Ht 5\' 11"  (1.803 m)   Wt 208 lb 6.4 oz (94.5 kg)   SpO2 96%   BMI 29.07 kg/m   Physical Exam:  Well appearing NAD HEENT: Unremarkable Neck:  No JVD, no thyromegally Lymphatics:  No adenopathy Back:  No CVA tenderness Lungs:  Clear HEART:  Regular rate rhythm, no murmurs, no rubs, no clicks Abd:  soft, positive bowel sounds, no organomegally, no rebound, no guarding Ext:  2 plus pulses, no edema, no cyanosis, no clubbing Skin:  No rashes no nodules Neuro:  CN II through XII intact, motor grossly intact  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  See PaceArt for details.   Assess/Plan: 1. Sinus node dysfunction - he has undergone PPM insertion and is doing well. His underlying HR today is 40/min. He feels well with more energy and is able to work and do strenuous physical activity. 2. PVC/AT - he appears to be maintaining NSR nicely on low dose amiodarone. Continue. I will consider weaning his dose down when I see him next. 3. HTN  - his bp is well controlled. We will follow. 4. Cough - we were initially concerned that this might be due to [facility] but it has resolved with cessation of his ACE.   Sharlot Gowda Velvia Mehrer,MD

## 2023-04-15 ENCOUNTER — Encounter: Payer: Self-pay | Admitting: Student

## 2023-04-15 ENCOUNTER — Other Ambulatory Visit: Payer: Self-pay

## 2023-04-15 ENCOUNTER — Ambulatory Visit: Payer: 59 | Admitting: Student

## 2023-04-15 VITALS — BP 118/70 | HR 70 | Temp 97.8°F | Resp 18 | Ht 71.0 in | Wt 204.5 lb

## 2023-04-15 DIAGNOSIS — R42 Dizziness and giddiness: Secondary | ICD-10-CM | POA: Insufficient documentation

## 2023-04-15 MED ORDER — AMLODIPINE BESYLATE 10 MG PO TABS: 10.0000 mg | ORAL_TABLET | Freq: Every day | ORAL | 0 refills | Status: DC

## 2023-04-15 NOTE — Progress Notes (Signed)
Established Patient Office Visit  Subjective   Patient ID: Luis Hood, male    DOB: [birth year]  Age: 77 y.o. MRN: [id number]  Chief Complaint  Patient presents with   Office visit    Dizzy, Feet swelling    Luis Hood is a 77 year old presents today with history of COPD, mixed hyperlipidemia, CAD, and PPD He reports a consistent cough for over 6 months now. During the coughing episodes he feels like he has coughed so hard that his head hurts and he passes out. Most recently he coughed around a friend, the friend was concerned because as he states "I looked like I went away for a few seconds." He denies chest pain, palpitations, loss of consciousness, nor shortness of breath. He is positive for dizziness and ankle swelling. Dr. Sharrell Ku follows him for AT, PVC's and LV  dysfunction. He is a current smoker of cigars.   Today he requests refills, however he has forgotten which medication it was.        Review of Systems  Constitutional:  Positive for malaise/fatigue. Negative for chills, fever and weight loss.  Eyes: Negative.   Respiratory:  Positive for cough and sputum production.   Cardiovascular:  Negative for chest pain, palpitations, orthopnea and leg swelling.  Gastrointestinal: Negative.   Musculoskeletal: Negative.   Neurological:  Positive for dizziness.  Psychiatric/Behavioral: Negative.        Objective:     BP 118/70 (BP Location: Left Arm, Patient Position: Sitting, Cuff Size: Normal)   Pulse 70   Temp 97.8 F (36.6 C)   Resp 18   Ht 5\' 11"  (1.803 m)   Wt 204 lb 8 oz (92.8 kg)   SpO2 95%   BMI 28.52 kg/m    Physical Exam Vitals reviewed.  Constitutional:      Appearance: Normal appearance. He is obese.  Eyes:     Extraocular Movements: Extraocular movements intact.     Pupils: Pupils are equal, round, and reactive to light.  Cardiovascular:     Rate and Rhythm: Normal rate and regular rhythm.     Pulses: Normal pulses.     Heart  sounds: Normal heart sounds. No murmur heard. Pulmonary:     Effort: Pulmonary effort is normal.     Breath sounds: No stridor. Examination of the right-lower field reveals decreased breath sounds. Examination of the left-lower field reveals decreased breath sounds. Decreased breath sounds present.  Abdominal:     General: Abdomen is flat. Bowel sounds are normal.     Palpations: Abdomen is soft. There is mass.  Musculoskeletal:        General: Normal range of motion.  Skin:    General: Skin is warm and dry.     Capillary Refill: Capillary refill takes less than 2 seconds.  Neurological:     Mental Status: He is alert. Mental status is at baseline.     Motor: No weakness.     Gait: Gait normal.  Psychiatric:        Mood and Affect: Mood normal.        Behavior: Behavior normal.     Orthostatic Vitals for the past 48 hrs (Last 6 readings):  Patient Position BP Pulse BP Location Cuff Size  04/15/23 0957 Sitting 118/70 70 Left Arm Normal      Assessment & Plan:   Problem List Items Addressed This Visit     Dizziness of unknown etiology - Primary    I strongly encouraged  him to call the Cardiology office for evaluation of vasovagal symptoms.     He was not orthostatic per vitals collected in office today.  I have collected labs for review. He is need of an annual physical to discuss chronic problems.        Relevant Orders   Orthostatic vital signs   Basic metabolic panel   CBC with Differential/Platelet    No follow-ups on file.    Luis Blow, NP

## 2023-04-15 NOTE — Assessment & Plan Note (Addendum)
I strongly encouraged him to call the Cardiology office for evaluation of vasovagal symptoms.     He was not orthostatic per vitals collected in office today.  I have collected labs for review. He is need of an annual physical to discuss chronic problems.

## 2023-04-16 LAB — BASIC METABOLIC PANEL
BUN/Creatinine Ratio: 11 (ref 10–24)
BUN: 17 mg/dL (ref 8–27)
CO2: 23 mmol/L (ref 20–29)
Calcium: 9.9 mg/dL (ref 8.6–10.2)
Chloride: 103 mmol/L (ref 96–106)
Creatinine, Ser: 1.57 mg/dL — ABNORMAL HIGH (ref 0.76–1.27)
Glucose: 95 mg/dL (ref 70–99)
Potassium: 4.7 mmol/L (ref 3.5–5.2)
Sodium: 144 mmol/L (ref 134–144)
eGFR: 45 mL/min/{1.73_m2} — ABNORMAL LOW (ref >59)

## 2023-04-16 LAB — CBC WITH DIFFERENTIAL/PLATELET
Basophils Absolute: 0 10*3/uL (ref 0.0–0.2)
Basos: 0 %
EOS (ABSOLUTE): 0.3 10*3/uL (ref 0.0–0.4)
Eos: 5 %
Hematocrit: 48.4 % (ref 37.5–51.0)
Hemoglobin: 16.1 g/dL (ref 13.0–17.7)
Immature Grans (Abs): 0 10*3/uL (ref 0.0–0.1)
Immature Granulocytes: 1 %
Lymphocytes Absolute: 1.4 10*3/uL (ref 0.7–3.1)
Lymphs: 27 %
MCH: 29.1 pg (ref 26.6–33.0)
MCHC: 33.3 g/dL (ref 31.5–35.7)
MCV: 88 fL (ref 79–97)
Monocytes Absolute: 0.4 10*3/uL (ref 0.1–0.9)
Monocytes: 8 %
Neutrophils Absolute: 3.1 10*3/uL (ref 1.4–7.0)
Neutrophils: 59 %
Platelets: 222 10*3/uL (ref 150–450)
RBC: 5.53 x10E6/uL (ref 4.14–5.80)
RDW: 14.4 % (ref 11.6–15.4)
WBC: 5.2 10*3/uL (ref 3.4–10.8)

## 2023-04-16 NOTE — Progress Notes (Signed)
His kidney function is worsening. He should stop taking the hydrochlorothiazide as it could be a contributing factor. Have him come in for a urine mirco/albu. He should monitor his blood pressures at home with goal of less than 130/90. It appears that his hypertension has improved and we want to keep it that way.  He needs to make an appointment with [place] in 1 month for a fasting labs and annual physical.

## 2023-04-18 ENCOUNTER — Telehealth: Payer: Self-pay

## 2023-04-18 NOTE — Telephone Encounter (Signed)
Pt notified of labs and scheduled for 1 month F/U. Pt will D/C hydrochlorothiazide and will come in next for urine microalbumin

## 2023-04-18 NOTE — Telephone Encounter (Signed)
-----   Message from [place] L Leak sent at 04/16/2023  4:44 PM EDT ----- His kidney function is worsening. He should stop taking the hydrochlorothiazide as it could be a contributing factor. Have him come in for a urine mirco/albu. He should monitor his blood pressures at home with goal of less than 130/90. It appears that his hypertension has improved and we want to keep it that way.  He needs to make an appointment with [place] in 1 month for a fasting labs and annual physical.

## 2023-04-22 ENCOUNTER — Other Ambulatory Visit: Payer: Self-pay | Admitting: Internal Medicine

## 2023-04-24 ENCOUNTER — Other Ambulatory Visit: Payer: Self-pay | Admitting: Student

## 2023-05-27 ENCOUNTER — Ambulatory Visit: Payer: 59 | Admitting: Student

## 2023-05-27 ENCOUNTER — Encounter: Payer: Self-pay | Admitting: Student

## 2023-05-27 VITALS — BP 116/66 | HR 65 | Temp 97.2°F | Resp 17 | Ht 71.0 in | Wt 200.2 lb

## 2023-05-27 DIAGNOSIS — I4510 Unspecified right bundle-branch block: Secondary | ICD-10-CM | POA: Diagnosis not present

## 2023-05-27 DIAGNOSIS — B353 Tinea pedis: Secondary | ICD-10-CM | POA: Diagnosis not present

## 2023-05-27 DIAGNOSIS — R42 Dizziness and giddiness: Secondary | ICD-10-CM | POA: Diagnosis not present

## 2023-05-27 DIAGNOSIS — Z6827 Body mass index (BMI) 27.0-27.9, adult: Secondary | ICD-10-CM

## 2023-05-27 DIAGNOSIS — Z1329 Encounter for screening for other suspected endocrine disorder: Secondary | ICD-10-CM | POA: Diagnosis not present

## 2023-05-27 DIAGNOSIS — Z0000 Encounter for general adult medical examination without abnormal findings: Secondary | ICD-10-CM | POA: Diagnosis not present

## 2023-05-27 DIAGNOSIS — I4729 Other ventricular tachycardia: Secondary | ICD-10-CM | POA: Diagnosis not present

## 2023-05-27 DIAGNOSIS — Z131 Encounter for screening for diabetes mellitus: Secondary | ICD-10-CM | POA: Insufficient documentation

## 2023-05-27 DIAGNOSIS — E782 Mixed hyperlipidemia: Secondary | ICD-10-CM | POA: Diagnosis not present

## 2023-05-27 NOTE — Progress Notes (Signed)
Preventive Screening-Counseling & Management     Robertlee Gogue is a 77 y.o. male who presents for Medicare Annual/Subsequent preventive examination.  HPI   Are there smokers in your home (other than you)? No  Risk Factors Current exercise habits: The patient has a physically strenuous job, but has no regular exercise apart from work.   Dietary issues discussed: he says   Depression Screen (Note: if answer to either of the following is "Yes", a more complete depression screening is indicated)   Over the past two weeks, have you felt down, depressed or hopeless? Yes  Over the past two weeks, have you felt little interest or pleasure in doing things? Yes  Have you lost interest or pleasure in daily life? No  Do you often feel hopeless? No  Do you cry easily over simple problems? No  Activities of Daily Living In your present state of health, do you have any difficulty performing the following activities?:  Driving? No uses moped Managing money?  No Feeding yourself? No Getting from bed to chair? No Climbing a flight of stairs? No Preparing food and eating?: No Bathing or showering? No Getting dressed: No Getting to the toilet? 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  Yes  Do you have more than one partner?  No  Hearing Difficulties: No Do you often ask people to speak up or repeat themselves? No Do you experience ringing or noises in your ears? No Do you have difficulty understanding soft or whispered voices? No   Do you feel that you have a problem with memory? Yes  Do you often misplace items? No  Do you feel safe at home?  Yes  Cognitive Testing  Alert? Yes  Normal Appearance?Yes  Oriented to person? Yes  Place? Yes   Time? Yes  Recall of three objects?  Yes  Can perform simple calculations?  Yes  Displays appropriate judgment?Yes  Can read the correct time from a watch face?Yes  Fall Risk Prevention  Any stairs in or around the home? No  If so, are there any without handrails? No  Home free of loose throw rugs in walkways, pet beds, electrical cords, etc? Yes  Adequate lighting in your home to reduce risk of falls? Yes  Use of a cane, walker or w/c? No    Time Up and Go  Was the test performed? Yes .  Length of time to ambulate 10 feet: 3 sec.   Gait steady and fast without use of assistive device    Advanced Directives have been discussed with the patient? 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   Tinea pedis of both feet 05/13/2016    Past Surgical History:  Procedure Laterality Date   CATARACT EXTRACTION Bilateral    HERNIA REPAIR     LEFT HEART CATH AND CORONARY  ANGIOGRAPHY N/A 10/28/2019   Procedure: LEFT HEART CATH AND CORONARY ANGIOGRAPHY;  Surgeon: Marykay Lex, MD;  Location: [facility] INVASIVE CV LAB;  Service: Cardiovascular;  Laterality: N/A;   PACEMAKER IMPLANT N/A 11/27/2020   Procedure: PACEMAKER IMPLANT;  Surgeon: Marinus Maw, MD;  Location: [facility] INVASIVE CV LAB;  Service: Cardiovascular;  Laterality: N/A;   REPLACEMENT TOTAL KNEE Bilateral    TONSILLECTOMY        Current Medications  Current Outpatient Medications  Medication Sig Dispense Refill   acetaminophen (TYLENOL) 500 MG tablet Take 1,000 mg by mouth every 6 (six) hours as needed for moderate pain or headache.     albuterol  (VENTOLIN HFA) 108 (90 Base) MCG/ACT inhaler Inhale 1-2 puffs into the lungs every 6 (six) hours as needed for wheezing or shortness of breath.      amiodarone (PACERONE) 200 MG tablet TAKE ONE TABLET BY MOUTH EVERY DAY EXCEPT SATURDAY AND SUNDAY 90 tablet 2   amLODipine (NORVASC) 10 MG tablet TAKE ONE TABLET BY MOUTH EVERY DAY. NEEDAPPOINTMENT FOR REFILLS 30 tablet 0   FARXIGA 10 MG TABS tablet TAKE 1 TABLET BY MOUTH EVERY MORNING 90 tablet 1   FLOVENT HFA 110 MCG/ACT inhaler Inhale 2 puffs into the lungs as needed. 1 each 3   Multiple Vitamin (MULTIVITAMIN WITH MINERALS) TABS tablet Take 1 tablet by mouth daily.     Naphazoline-Glycerin (REDNESS RELIEF OP) Place 1 drop into both eyes daily as needed (redness/ irritation).     nitroGLYCERIN (NITROSTAT) 0.4 MG SL tablet Place 0.4 mg under the tongue every 5 (five) minutes as needed for chest pain.     Omega-3 Fatty Acids (FISH OIL) 1000 MG CAPS Take 1,000 mg by mouth daily.     omeprazole (PRILOSEC) 20 MG capsule TAKE 2 CAPSULES BY MOUTH DAILY 90 capsule 1   rosuvastatin (CRESTOR) 20 MG tablet Take 1 tablet (20 mg total) by mouth daily. Please contact the office to schedule appointment for additional refills. 2ed Attempt. 15 tablet 0   No current facility-administered medications for this visit.    Allergies Patient has no known allergies.   Social History Social History   Tobacco Use   Smoking status: Every Day    Types: Cigars   Smokeless tobacco: Never  Substance Use Topics   Alcohol use: Yes    Alcohol/week: 2.0 standard drinks of alcohol    Types: 2 Shots of liquor per week    Comment: 2 drinks a day     Review of Systems Review of Systems  Constitutional: Negative.   Eyes: Negative.   Respiratory: Negative.    Cardiovascular: Negative.   Gastrointestinal: Negative.   Genitourinary: Negative.   Musculoskeletal:  Positive for joint pain.  Skin:  Positive for itching.  Neurological:  Positive for dizziness.   Psychiatric/Behavioral: Negative.       Physical Exam:      Body mass index is 27.93 kg/m. BP 116/66 (BP Location: Right Arm, Patient Position: Sitting, Cuff Size: Normal)   Pulse 65   Temp (!) 97.2 F (36.2 C)   Resp 17   Ht 5\' 11"  (1.803 m)   Wt 200 lb 4 oz (90.8 kg)   SpO2 97%   BMI 27.93 kg/m   Physical Exam Vitals reviewed.  Constitutional:      Appearance: Normal appearance.  HENT:     Head: Normocephalic and atraumatic.     Right Ear: Tympanic membrane and ear canal normal.     Left Ear:  Tympanic membrane normal. There is impacted cerumen.     Nose: Nose normal.     Mouth/Throat:     Mouth: Mucous membranes are moist.     Pharynx: Oropharynx is clear.  Eyes:     Extraocular Movements: Extraocular movements intact.     Pupils: Pupils are equal, round, and reactive to light.  Cardiovascular:     Rate and Rhythm: Normal rate.     Pulses: Normal pulses.     Heart sounds: Normal heart sounds.  Pulmonary:     Effort: Pulmonary effort is normal.     Breath sounds: Normal breath sounds.  Abdominal:     General: There is distension.     Palpations: Abdomen is soft.  Musculoskeletal:        General: Normal range of motion.     Cervical back: Normal range of motion and neck supple.  Skin:    General: Skin is warm.     Capillary Refill: Capillary refill takes less than 2 seconds.  Neurological:     Mental Status: He is alert and oriented to person, place, and time.  Psychiatric:        Mood and Affect: Mood normal.      Assessment:    Encounter for subsequent annual wellness visit (AWV) in Medicare patient -     Hemoglobin A1c -     Microalbumin / creatinine urine ratio -     Lipid panel -     CBC with Differential/Platelet -     Comprehensive metabolic panel -     VITAMIN D 25 Hydroxy (Vit-D Deficiency, Fractures)      Plan:     During the course of the visit the patient was educated and counseled about appropriate screening and preventive  services including:   Pneumococcal vaccine  Influenza vaccine Smoking cessation counseling Advanced directives: has NO advanced directive - not interested in additional information  Diet review for nutrition referral? Yes ____  Not Indicated __x__   Prevention   Medicare Attestation I have personally reviewed: The patient's medical and social history Their use of alcohol, tobacco or illicit drugs Their current medications and supplements The patient's functional ability including ADLs,fall risks, home safety risks, cognitive, and hearing and visual impairment Diet and physical activities Evidence for depression or mood disorders  The patient's weight, height, and BMI have been recorded in the chart.  I have made referrals, counseling, and provided education to the patient based on review of the above and I have provided the patient with a written personalized care plan for preventive services.     Edwena Blow, NP   05/27/2023

## 2023-05-28 LAB — VITAMIN D 25 HYDROXY (VIT D DEFICIENCY, FRACTURES): Vit D, 25-Hydroxy: 57.9 ng/mL (ref 30.0–100.0)

## 2023-05-28 LAB — LIPID PANEL
Chol/HDL Ratio: 4.7 ratio (ref 0.0–5.0)
Cholesterol, Total: 179 mg/dL (ref 100–199)
HDL: 38 mg/dL — ABNORMAL LOW (ref >39)
LDL Chol Calc (NIH): 103 mg/dL — ABNORMAL HIGH (ref 0–99)
Triglycerides: 219 mg/dL — ABNORMAL HIGH (ref 0–149)
VLDL Cholesterol Cal: 38 mg/dL (ref 5–40)

## 2023-05-28 LAB — COMPREHENSIVE METABOLIC PANEL
ALT: 15 IU/L (ref 0–44)
AST: 19 IU/L (ref 0–40)
Albumin: 4.4 g/dL (ref 3.8–4.8)
Alkaline Phosphatase: 63 IU/L (ref 44–121)
BUN/Creatinine Ratio: 14 (ref 10–24)
BUN: 18 mg/dL (ref 8–27)
Bilirubin Total: 0.7 mg/dL (ref 0.0–1.2)
CO2: 25 mmol/L (ref 20–29)
Calcium: 9.5 mg/dL (ref 8.6–10.2)
Chloride: 104 mmol/L (ref 96–106)
Creatinine, Ser: 1.32 mg/dL — ABNORMAL HIGH (ref 0.76–1.27)
Globulin, Total: 2.8 g/dL (ref 1.5–4.5)
Glucose: 86 mg/dL (ref 70–99)
Potassium: 4.5 mmol/L (ref 3.5–5.2)
Sodium: 141 mmol/L (ref 134–144)
Total Protein: 7.2 g/dL (ref 6.0–8.5)
eGFR: 56 mL/min/{1.73_m2} — ABNORMAL LOW (ref >59)

## 2023-05-28 LAB — CBC WITH DIFFERENTIAL/PLATELET
Basophils Absolute: 0 10*3/uL (ref 0.0–0.2)
Basos: 1 %
EOS (ABSOLUTE): 0.3 10*3/uL (ref 0.0–0.4)
Eos: 6 %
Hematocrit: 51 % (ref 37.5–51.0)
Hemoglobin: 16.8 g/dL (ref 13.0–17.7)
Immature Grans (Abs): 0 10*3/uL (ref 0.0–0.1)
Immature Granulocytes: 0 %
Lymphocytes Absolute: 1.6 10*3/uL (ref 0.7–3.1)
Lymphs: 28 %
MCH: 28.3 pg (ref 26.6–33.0)
MCHC: 32.9 g/dL (ref 31.5–35.7)
MCV: 86 fL (ref 79–97)
Monocytes Absolute: 0.4 10*3/uL (ref 0.1–0.9)
Monocytes: 8 %
Neutrophils Absolute: 3.3 10*3/uL (ref 1.4–7.0)
Neutrophils: 57 %
Platelets: 226 10*3/uL (ref 150–450)
RBC: 5.93 x10E6/uL — ABNORMAL HIGH (ref 4.14–5.80)
RDW: 13.4 % (ref 11.6–15.4)
WBC: 5.7 10*3/uL (ref 3.4–10.8)

## 2023-05-28 LAB — MICROALBUMIN / CREATININE URINE RATIO
Creatinine, Urine: 138.4 mg/dL
Microalb/Creat Ratio: 4 mg/g{creat} (ref 0–29)
Microalbumin, Urine: 5.9 ug/mL

## 2023-05-28 LAB — HEMOGLOBIN A1C
Est. average glucose Bld gHb Est-mCnc: 108 mg/dL
Hgb A1c MFr Bld: 5.4 % (ref 4.8–5.6)

## 2023-05-30 ENCOUNTER — Other Ambulatory Visit: Payer: Self-pay | Admitting: Student

## 2023-05-30 MED ORDER — ROSUVASTATIN CALCIUM 20 MG PO TABS: 20.0000 mg | ORAL_TABLET | Freq: Every day | ORAL | 2 refills | Status: DC

## 2023-05-30 NOTE — Progress Notes (Signed)
I have reviewed your lab results. Your kidney function and blood counts are stable.  Your cholesterol is slightly elevated. Try to avoid fried foods and fatty foods (red meats, pork products, whole milk products, and egg yolks) and try to eat more fresh fruits and vegetables.  You should be taking the rosuvastatin 20 mg every day at bedtime. I am sending in a refill. Taking this mediation will help your levels come down.

## 2023-07-08 ENCOUNTER — Other Ambulatory Visit: Payer: Self-pay | Admitting: Internal Medicine

## 2023-07-09 ENCOUNTER — Other Ambulatory Visit: Payer: Self-pay | Admitting: Student

## 2023-07-09 MED ORDER — DAPAGLIFLOZIN PROPANEDIOL 10 MG PO TABS: 10.0000 mg | ORAL_TABLET | Freq: Every day | ORAL | 1 refills | Status: DC

## 2023-07-30 ENCOUNTER — Other Ambulatory Visit: Payer: Self-pay | Admitting: Internal Medicine

## 2023-07-30 ENCOUNTER — Other Ambulatory Visit: Payer: Self-pay | Admitting: Student

## 2023-08-11 ENCOUNTER — Other Ambulatory Visit: Payer: Self-pay | Admitting: Internal Medicine

## 2023-09-17 DIAGNOSIS — H43393 Other vitreous opacities, bilateral: Secondary | ICD-10-CM | POA: Diagnosis not present

## 2023-09-17 DIAGNOSIS — H524 Presbyopia: Secondary | ICD-10-CM | POA: Diagnosis not present

## 2023-09-19 ENCOUNTER — Other Ambulatory Visit: Payer: Self-pay | Admitting: Internal Medicine

## 2023-10-21 ENCOUNTER — Other Ambulatory Visit: Payer: Self-pay | Admitting: Internal Medicine

## 2023-11-17 ENCOUNTER — Other Ambulatory Visit: Payer: Self-pay | Admitting: Student

## 2023-11-17 ENCOUNTER — Other Ambulatory Visit: Payer: Self-pay | Admitting: Internal Medicine

## 2023-11-18 ENCOUNTER — Ambulatory Visit: Payer: 59 | Admitting: Student

## 2023-11-18 ENCOUNTER — Other Ambulatory Visit: Payer: Self-pay | Admitting: Student

## 2023-11-18 ENCOUNTER — Encounter: Payer: Self-pay | Admitting: Student

## 2023-11-18 VITALS — BP 118/76 | HR 64 | Temp 97.4°F | Resp 18 | Ht 71.0 in | Wt 203.4 lb

## 2023-11-18 DIAGNOSIS — M7138 Other bursal cyst, other site: Secondary | ICD-10-CM | POA: Diagnosis not present

## 2023-11-18 DIAGNOSIS — I2510 Atherosclerotic heart disease of native coronary artery without angina pectoris: Secondary | ICD-10-CM | POA: Diagnosis not present

## 2023-11-18 DIAGNOSIS — M546 Pain in thoracic spine: Secondary | ICD-10-CM | POA: Diagnosis not present

## 2023-11-18 DIAGNOSIS — I10 Essential (primary) hypertension: Secondary | ICD-10-CM | POA: Diagnosis not present

## 2023-11-18 MED ORDER — GABAPENTIN 300 MG PO CAPS: 300.0000 mg | ORAL_CAPSULE | Freq: Every day | ORAL | 1 refills | Status: DC

## 2023-11-18 MED ORDER — LIDOCAINE 5 % EX PTCH
1.0000 | MEDICATED_PATCH | CUTANEOUS | 0 refills | Status: DC
Start: 2023-11-18 — End: 2024-07-09

## 2023-11-18 NOTE — Assessment & Plan Note (Addendum)
His blood pressures have been stable and controlled over time however he does have a history history of long-term use of amiodarone.  Labs TSH and CMP collected today to evaluate for thyroid toxicity.  Patients next appt with cardiology is not until June 2025.   I believe the swelling of his feet to be related to amlodipine 10 mg.  There is no obvious swelling today.   -He can elevate his feet and use compression hose if needed. -The patient should be following up every 3 months for chronic disease management.

## 2023-11-18 NOTE — Progress Notes (Signed)
Acute Office Visit  Subjective:     Patient ID: Luis Hood, male    DOB: 09/25/1946, 78 y.o.   MRN: [id number]  Chief Complaint  Patient presents with   Foot Swelling    Bilateral foot pain and swelling, also noticed a lump on middle of back (right side). Has been bothering him for a while.    HPI  Luis Hood is a 78 year old Black male who presents with concerns for bilateral foot pain and a knot on his back. He reports a "grabbing" pain in the middle of his back and he believes that he feels a knot there. He says this has been going on for almost 2 weeks. To alleviate the pain he has taken tylenol pm and has slept on the floor, he says his bed is too soft. He denies any injuries, lower extremity weakness.   The patient also reports bilateral foot swelling and states " it feels that I am walking on marbles".  With this foot swelling he says his toes looked like sausages.  Today he denies any pain, swelling, loss of sensation and denies injuries.  The patient does not have a history of diabetes.  At home he has used elevation that has seemed to reduce the swelling.  He notes seeing a podiatrist in the past but is not quite sure of what was done at those appointments.   Review of Systems  Constitutional: Negative.   HENT: Negative.    Eyes: Negative.   Respiratory: Negative.    Cardiovascular: Negative.  Negative for chest pain, palpitations, orthopnea and leg swelling.  Gastrointestinal: Negative.   Genitourinary: Negative.   Musculoskeletal:  Positive for back pain. Negative for falls.  Skin: Negative.   Neurological: Negative.   Psychiatric/Behavioral:  Positive for memory loss.         Objective:    BP 118/76 (BP Location: Right Arm, Patient Position: Sitting, Cuff Size: Normal)   Pulse 64   Temp (!) 97.4 F (36.3 C) (Temporal)   Resp 18   Ht 5\' 11"  (1.803 m)   Wt 203 lb 6 oz (92.3 kg)   SpO2 98%   BMI 28.37 kg/m    Physical Exam Vitals reviewed.   Constitutional:      Appearance: Normal appearance.  Cardiovascular:     Rate and Rhythm: Normal rate and regular rhythm.     Pulses: Normal pulses.     Heart sounds: Normal heart sounds. No murmur heard. Pulmonary:     Effort: Pulmonary effort is normal.     Breath sounds: Normal breath sounds.  Abdominal:     General: Bowel sounds are normal.     Palpations: Abdomen is soft.  Musculoskeletal:        General: No swelling, deformity or signs of injury.     Cervical back: Normal.     Thoracic back: Tenderness present. No swelling, deformity, signs of trauma, spasms or bony tenderness. Normal range of motion.     Lumbar back: Normal.       Back:     Right lower leg: No edema.     Left lower leg: No edema.     Comments: Large mobile nodule felt under the skin surface, no erythema or tenderness.  Another is present on the left abdomen.   Skin:    General: Skin is warm and dry.     Capillary Refill: Capillary refill takes less than 2 seconds.     Comments: Multiple open comedones  located thoracic spine  Neurological:     General: No focal deficit present.     Mental Status: He is alert. Mental status is at baseline.  Psychiatric:        Mood and Affect: Mood normal.        Behavior: Behavior normal.     No results found for any visits on 11/18/23.      Assessment & Plan:   Problem List Items Addressed This Visit     Essential hypertension   His blood pressures have been stable and controlled over time however he does have a history history of long-term use of amiodarone.  Labs TSH and CMP collected today to evaluate for thyroid toxicity.  Patients next appt with cardiology is not until June 2025.   I believe the swelling of his feet to be related to amlodipine 10 mg.  There is no obvious swelling today.   -He can elevate his feet and use compression hose if needed. -The patient should be following up every 3 months for chronic disease management.      Relevant Orders    TSH   Comprehensive metabolic panel   Cyst of thoracic facet joint - Primary   2 large mobile nonfluctuant cyst present to the left abdomen and thoracic spine.  They do not appear to be infected and are not painful to palpation.  He should be evaluated by general surgery for removal.  -Order placed for general surgery evaluation.      Acute bilateral thoracic back pain   His back pain appears to be musculoskeletal in nature.  I recommend that he does not sleep on the floor but try to obtain most stiffer mattress.   -Continue using Tylenol, advised against Tylenol PM due to sedation. -Lidocaine patch every 12 hours as needed -Trial gabapentin 300 mg at bedtime. -Heat and gentle exercising discussed  Information provided patient verbalized understanding      Relevant Medications   lidocaine (LIDODERM) 5 %   gabapentin (NEURONTIN) 300 MG capsule    Meds ordered this encounter  Medications   lidocaine (LIDODERM) 5 %    Sig: Place 1 patch onto the skin daily. Remove & Discard patch within 12 hours or as directed by MD    Dispense:  15 patch    Refill:  0   gabapentin (NEURONTIN) 300 MG capsule    Sig: Take 1 capsule (300 mg total) by mouth at bedtime.    Dispense:  30 capsule    Refill:  1    Return in about 3 months (around 02/15/2024) for follow up.  Edwena Blow, NP

## 2023-11-18 NOTE — Assessment & Plan Note (Signed)
2 large mobile nonfluctuant cyst present to the left abdomen and thoracic spine.  They do not appear to be infected and are not painful to palpation.  He should be evaluated by general surgery for removal.  -Order placed for general surgery evaluation.

## 2023-11-18 NOTE — Assessment & Plan Note (Signed)
His back pain appears to be musculoskeletal in nature.  I recommend that he does not sleep on the floor but try to obtain most stiffer mattress.   -Continue using Tylenol, advised against Tylenol PM due to sedation. -Lidocaine patch every 12 hours as needed -Trial gabapentin 300 mg at bedtime. -Heat and gentle exercising discussed  Information provided patient verbalized understanding

## 2023-11-18 NOTE — Patient Instructions (Addendum)
DO NOT TAKE TYLENOL PM  Take tylenol extra strength every 6 hours for pain.  Gabapentin 300 mg at bedtime   I have placed a referral to general surgery to evaluate the cyst on your back and abdomen.

## 2023-11-18 NOTE — Assessment & Plan Note (Addendum)
Marland Kitchen

## 2023-11-19 LAB — COMPREHENSIVE METABOLIC PANEL
ALT: 12 [IU]/L (ref 0–44)
AST: 17 [IU]/L (ref 0–40)
Albumin: 4.5 g/dL (ref 3.8–4.8)
Alkaline Phosphatase: 61 [IU]/L (ref 44–121)
BUN/Creatinine Ratio: 12 (ref 10–24)
BUN: 16 mg/dL (ref 8–27)
Bilirubin Total: 0.5 mg/dL (ref 0.0–1.2)
CO2: 26 mmol/L (ref 20–29)
Calcium: 9.5 mg/dL (ref 8.6–10.2)
Chloride: 106 mmol/L (ref 96–106)
Creatinine, Ser: 1.29 mg/dL — ABNORMAL HIGH (ref 0.76–1.27)
Globulin, Total: 2.5 g/dL (ref 1.5–4.5)
Glucose: 87 mg/dL (ref 70–99)
Potassium: 4.6 mmol/L (ref 3.5–5.2)
Sodium: 143 mmol/L (ref 134–144)
Total Protein: 7 g/dL (ref 6.0–8.5)
eGFR: 57 mL/min/{1.73_m2} — ABNORMAL LOW (ref >59)

## 2023-11-19 LAB — TSH: TSH: 1.26 u[IU]/mL (ref 0.450–4.500)

## 2023-11-20 NOTE — Progress Notes (Signed)
Please inform the patient that his kidney and thyroid function are both stable.  Continue medications as prescribed.

## 2024-01-01 ENCOUNTER — Other Ambulatory Visit: Payer: Self-pay | Admitting: Student

## 2024-01-01 ENCOUNTER — Other Ambulatory Visit: Payer: Self-pay | Admitting: Internal Medicine

## 2024-01-08 DIAGNOSIS — B351 Tinea unguium: Secondary | ICD-10-CM | POA: Diagnosis not present

## 2024-01-08 DIAGNOSIS — M79671 Pain in right foot: Secondary | ICD-10-CM | POA: Diagnosis not present

## 2024-01-08 DIAGNOSIS — M79672 Pain in left foot: Secondary | ICD-10-CM | POA: Diagnosis not present

## 2024-01-27 ENCOUNTER — Other Ambulatory Visit: Payer: Self-pay | Admitting: Student

## 2024-02-11 ENCOUNTER — Ambulatory Visit: Payer: 59 | Admitting: Student

## 2024-02-11 ENCOUNTER — Encounter: Payer: Self-pay | Admitting: Student

## 2024-02-11 VITALS — BP 132/74 | HR 68 | Temp 98.3°F | Resp 18 | Ht 71.0 in | Wt 200.6 lb

## 2024-02-11 DIAGNOSIS — N1831 Chronic kidney disease, stage 3a: Secondary | ICD-10-CM

## 2024-02-11 DIAGNOSIS — I10 Essential (primary) hypertension: Secondary | ICD-10-CM

## 2024-02-11 MED ORDER — AMLODIPINE BESYLATE 10 MG PO TABS: 10.0000 mg | ORAL_TABLET | Freq: Every day | ORAL | 1 refills | Status: DC

## 2024-02-11 NOTE — Addendum Note (Signed)
 Addended by: Nasario Badder on: 02/11/2024 01:58 PM   Modules accepted: Orders

## 2024-02-11 NOTE — Assessment & Plan Note (Signed)
 The patient's blood pressure was well-controlled on amlodipine  10 mg daily, indicating effective management of hypertension. He will see cardiology in June for annual evaluation.   Treatment: - Continued amlodipine  10 mg daily and rosuvastatin  20 mg daily. - Ordered a 90-day supply with a refill for amlodipine .  Tests: - Scheduled lipid panel for the next visit. -Every 3 month visits are not neccessary, we will see him in 6 months.   Patient Education: - Advised to continue healthy eating and physical activity. - Instructed to moderate alcohol consumption, especially when operating heavy machinery.

## 2024-02-11 NOTE — Assessment & Plan Note (Signed)
 The patient last kidney function was stable. We will continue to monitor with labs.   -Continue Farxiga  10 mg daily

## 2024-02-11 NOTE — Progress Notes (Signed)
 Established Patient Office Visit  Subjective   Patient ID: Luis Hood, male    DOB: 1946/07/10  Age: 78 y.o. MRN: [id number]  Chief Complaint  Patient presents with   Follow-up    3 Month F/U    HPI  Luis Hood is a 78 year old male who presents for 3 month follow up of HTN and Stage 3a chronic kidney disease.   The patients blood pressures have been stable, we discontinued lisinopril in the past due to cough. He is taking amlodipine  10 mg daily, he does not check his blood pressure at home. The patient denies extremity swelling, chest pain, palpitations, loss of consciousness, or shortness of breath. The patient has stage 3a chronic kidney disease, most recent eGFR in 11/2023 was 57 this is improved after initiation of Farxiga  10 mg last month. He reports still drinking alcohol but has increased water intake and denies urinary complaints or side effects from medications.   Patient reports falling off his lawn mower yesterday witnessed by his daughter.  He reports mild transient back pain that has resolved with use of tylenol . He denies any other injuries stating "I turned the wheel too hard."     Review of Systems  Musculoskeletal:  Positive for falls.  All other systems reviewed and are negative.     Objective:     BP 132/74   Pulse 68   Temp 98.3 F (36.8 C) (Temporal)   Resp 18   Ht 5\' 11"  (1.803 m)   Wt 200 lb 9.6 oz (91 kg)   SpO2 96%   BMI 27.98 kg/m    Physical Exam Constitutional:      Appearance: Normal appearance. He is obese. He is not ill-appearing.  HENT:     Head: Normocephalic and atraumatic.     Mouth/Throat:     Mouth: Mucous membranes are moist.     Pharynx: Oropharynx is clear.  Eyes:     Conjunctiva/sclera: Conjunctivae normal.     Pupils: Pupils are equal, round, and reactive to light.  Cardiovascular:     Rate and Rhythm: Normal rate and regular rhythm.     Pulses: Normal pulses.     Heart sounds: Normal heart sounds. No  murmur heard. Pulmonary:     Effort: Pulmonary effort is normal.     Breath sounds: Normal breath sounds.  Abdominal:     General: Bowel sounds are normal. There is no distension.     Palpations: Abdomen is soft.  Musculoskeletal:        General: Tenderness present. No signs of injury. Normal range of motion.     Right lower leg: No edema.     Left lower leg: No edema.  Skin:    General: Skin is warm and dry.     Capillary Refill: Capillary refill takes less than 2 seconds.  Neurological:     General: No focal deficit present.     Mental Status: He is alert and oriented to person, place, and time. Mental status is at baseline.  Psychiatric:        Mood and Affect: Mood normal.        Behavior: Behavior normal.        Thought Content: Thought content normal.        Judgment: Judgment normal.      No results found for any visits on 02/11/24.    The 10-year ASCVD risk score (Arnett DK, et al., 2019) is: 40.2%    Assessment &  Plan:   Problem List Items Addressed This Visit     Essential hypertension - Primary   The patient's blood pressure was well-controlled on amlodipine  10 mg daily, indicating effective management of hypertension. He will see cardiology in June for annual evaluation.   Treatment: - Continued amlodipine  10 mg daily and rosuvastatin  20 mg daily. - Ordered a 90-day supply with a refill for amlodipine .  Tests: - Scheduled lipid panel for the next visit. -Every 3 month visits are not neccessary, we will see him in 6 months.   Patient Education: - Advised to continue healthy eating and physical activity. - Instructed to moderate alcohol consumption, especially when operating heavy machinery.       Relevant Medications   amLODipine  (NORVASC ) 10 MG tablet   Chronic kidney disease, stage 3a (HCC)   The patient last kidney function was stable. We will continue to monitor with labs.   -Continue Farxiga  10 mg daily        Return in about 6 months  (around 08/13/2024) for follow up of HTN and CKD .    Luis Badder, NP

## 2024-02-17 DIAGNOSIS — I10 Essential (primary) hypertension: Secondary | ICD-10-CM | POA: Diagnosis not present

## 2024-02-17 DIAGNOSIS — D214 Benign neoplasm of connective and other soft tissue of abdomen: Secondary | ICD-10-CM | POA: Diagnosis not present

## 2024-02-17 DIAGNOSIS — D213 Benign neoplasm of connective and other soft tissue of thorax: Secondary | ICD-10-CM | POA: Diagnosis not present

## 2024-02-20 DIAGNOSIS — I452 Bifascicular block: Secondary | ICD-10-CM | POA: Diagnosis not present

## 2024-02-20 DIAGNOSIS — Z45018 Encounter for adjustment and management of other part of cardiac pacemaker: Secondary | ICD-10-CM | POA: Diagnosis not present

## 2024-02-20 DIAGNOSIS — Z01818 Encounter for other preprocedural examination: Secondary | ICD-10-CM | POA: Diagnosis not present

## 2024-02-20 DIAGNOSIS — I4719 Other supraventricular tachycardia: Secondary | ICD-10-CM | POA: Diagnosis not present

## 2024-02-20 DIAGNOSIS — I10 Essential (primary) hypertension: Secondary | ICD-10-CM | POA: Diagnosis not present

## 2024-02-20 DIAGNOSIS — I4510 Unspecified right bundle-branch block: Secondary | ICD-10-CM | POA: Diagnosis not present

## 2024-02-23 DIAGNOSIS — D171 Benign lipomatous neoplasm of skin and subcutaneous tissue of trunk: Secondary | ICD-10-CM | POA: Diagnosis not present

## 2024-02-23 DIAGNOSIS — L723 Sebaceous cyst: Secondary | ICD-10-CM | POA: Diagnosis not present

## 2024-02-23 DIAGNOSIS — L720 Epidermal cyst: Secondary | ICD-10-CM | POA: Diagnosis not present

## 2024-02-23 DIAGNOSIS — J449 Chronic obstructive pulmonary disease, unspecified: Secondary | ICD-10-CM | POA: Diagnosis not present

## 2024-02-23 DIAGNOSIS — D213 Benign neoplasm of connective and other soft tissue of thorax: Secondary | ICD-10-CM | POA: Diagnosis not present

## 2024-03-03 ENCOUNTER — Other Ambulatory Visit: Payer: Self-pay | Admitting: Internal Medicine

## 2024-03-05 ENCOUNTER — Other Ambulatory Visit: Payer: Self-pay

## 2024-03-05 MED ORDER — OMEPRAZOLE 20 MG PO CPDR
40.0000 mg | DELAYED_RELEASE_CAPSULE | Freq: Every day | ORAL | 1 refills | Status: DC
Start: 2024-03-05 — End: 2024-06-14

## 2024-03-05 MED ORDER — ALBUTEROL SULFATE HFA 108 (90 BASE) MCG/ACT IN AERS: 1.0000 | INHALATION_SPRAY | Freq: Four times a day (QID) | RESPIRATORY_TRACT | 0 refills | Status: DC | PRN

## 2024-04-12 ENCOUNTER — Other Ambulatory Visit: Payer: Self-pay

## 2024-04-12 ENCOUNTER — Other Ambulatory Visit: Payer: Self-pay | Admitting: Internal Medicine

## 2024-04-12 MED ORDER — ROSUVASTATIN CALCIUM 20 MG PO TABS: 20.0000 mg | ORAL_TABLET | Freq: Every day | ORAL | 2 refills | Status: AC

## 2024-04-12 NOTE — Progress Notes (Signed)
 Rx Refill

## 2024-05-18 DIAGNOSIS — M79671 Pain in right foot: Secondary | ICD-10-CM | POA: Diagnosis not present

## 2024-05-18 DIAGNOSIS — B351 Tinea unguium: Secondary | ICD-10-CM | POA: Diagnosis not present

## 2024-05-18 DIAGNOSIS — M79672 Pain in left foot: Secondary | ICD-10-CM | POA: Diagnosis not present

## 2024-05-18 DIAGNOSIS — M7989 Other specified soft tissue disorders: Secondary | ICD-10-CM | POA: Diagnosis not present

## 2024-05-18 DIAGNOSIS — R262 Difficulty in walking, not elsewhere classified: Secondary | ICD-10-CM | POA: Diagnosis not present

## 2024-05-28 ENCOUNTER — Encounter: Payer: Self-pay | Admitting: Internal Medicine

## 2024-05-28 ENCOUNTER — Ambulatory Visit: Admitting: Internal Medicine

## 2024-05-28 VITALS — BP 122/78 | HR 79 | Temp 97.8°F | Resp 18 | Ht 71.0 in | Wt 208.1 lb

## 2024-05-28 DIAGNOSIS — N1831 Chronic kidney disease, stage 3a: Secondary | ICD-10-CM | POA: Diagnosis not present

## 2024-05-28 DIAGNOSIS — J449 Chronic obstructive pulmonary disease, unspecified: Secondary | ICD-10-CM | POA: Diagnosis not present

## 2024-05-28 DIAGNOSIS — I10 Essential (primary) hypertension: Secondary | ICD-10-CM

## 2024-05-28 DIAGNOSIS — Z131 Encounter for screening for diabetes mellitus: Secondary | ICD-10-CM

## 2024-05-28 DIAGNOSIS — E782 Mixed hyperlipidemia: Secondary | ICD-10-CM | POA: Diagnosis not present

## 2024-05-28 DIAGNOSIS — G629 Polyneuropathy, unspecified: Secondary | ICD-10-CM | POA: Diagnosis not present

## 2024-05-28 MED ORDER — PREGABALIN 50 MG PO CAPS: 50.0000 mg | ORAL_CAPSULE | Freq: Two times a day (BID) | ORAL | 5 refills | Status: DC

## 2024-05-28 MED ORDER — BREZTRI AEROSPHERE 160-9-4.8 MCG/ACT IN AERO: 2.0000 | INHALATION_SPRAY | Freq: Two times a day (BID) | RESPIRATORY_TRACT | 1 refills | Status: AC

## 2024-05-28 NOTE — Assessment & Plan Note (Signed)
 I will do B12 and TSH level and start him on pregabalin  50 mg twice a day.  I will also do hemoglobin A1c make sure that he do not have diabetes

## 2024-05-28 NOTE — Assessment & Plan Note (Signed)
 He takes Farxiga  10 mg daily and I will do CMP today.

## 2024-05-28 NOTE — Progress Notes (Signed)
 Office Visit  Subjective   Patient ID: Luis Hood   DOB: [birth year]   Age: 78 y.o.   MRN: [id number]   Chief Complaint Chief Complaint  Patient presents with   feet Tingling    Feet tingling and achy     History of Present Illness   78 years old male with history of hypertension, coronary artery disease, COPD is here complaining of tingling feeling in his both hand and both feet.  He says that this is going on for few months.  He do not have diabetes mellitus.  He was given gabapentin  but that did not help so he has stopped that.    He is also complaining of stiffness in his neck.  No pain.    He has hypertension and he take amlodipine  10 mg daily and his blood pressure is controlled.    He also has hyperlipidemia and he take rosuvastatin  20 mg daily.  I will do lipid panel and CMP today.    He also has a coronary artery disease and he says that it is time for him to have cardiology appointment.    He has COPD and he does not take any inhaler.  He does admit that he gets short of breath.  He just take rescue inhaler albuterol .  Past Medical History Past Medical History:  Diagnosis Date   Chest pain 10/20/2019   Corn of toe 05/13/2016   COVID-19 10/06/2019   Dyslipidemia 10/20/2019   Dyspnea on exertion 10/20/2019   Essential hypertension 12/20/2014   Hammer toes of both feet 05/13/2016   Tinea pedis of both feet 05/13/2016     Allergies No Known Allergies   Review of Systems Review of Systems  Constitutional:  Positive for malaise/fatigue.  HENT: Negative.    Respiratory:  Positive for shortness of breath.   Cardiovascular: Negative.   Gastrointestinal: Negative.   Neurological:  Positive for tingling and sensory change.       Objective:    Vitals BP 122/78 (BP Location: Left Arm, Patient Position: Sitting, Cuff Size: Normal)   Pulse 79   Temp 97.8 F (36.6 C)   Resp 18   Ht 5' 11 (1.803 m)   Wt 208 lb 2 oz (94.4 kg)   SpO2 97%   BMI 29.03 kg/m     Physical Examination Physical Exam Constitutional:      Appearance: Normal appearance. He is obese.  HENT:     Head: Normocephalic and atraumatic.  Eyes:     Extraocular Movements: Extraocular movements intact.     Pupils: Pupils are equal, round, and reactive to light.  Cardiovascular:     Rate and Rhythm: Normal rate and regular rhythm.     Heart sounds: Normal heart sounds.  Pulmonary:     Effort: Pulmonary effort is normal.     Breath sounds: Normal breath sounds.  Abdominal:     General: Bowel sounds are normal.     Palpations: Abdomen is soft.  Neurological:     General: No focal deficit present.     Mental Status: He is alert and oriented to person, place, and time.        Assessment & Plan:   Chronic obstructive pulmonary disease (HCC)   I will start him on breztri   2 puff twice a day  Peripheral polyneuropathy  I will do B12 and TSH level and start him on pregabalin  50 mg twice a day.  I will also do hemoglobin A1c make sure that  he do not have diabetes  Chronic kidney disease, stage 3a (HCC)  He takes Farxiga  10 mg daily and I will do CMP today.  Mixed hyperlipidemia   He take rosuvastatin  20 mg daily without any side effect.  I will do a lipid panel and CMP today.  Essential hypertension   His blood pressure is controlled.    Return in about 1 month (around 06/28/2024) for AWE.   Roetta Dare, MD

## 2024-05-28 NOTE — Assessment & Plan Note (Signed)
 I will start him on breztri   2 puff twice a day

## 2024-05-28 NOTE — Assessment & Plan Note (Signed)
 He take rosuvastatin  20 mg daily without any side effect.  I will do a lipid panel and CMP today.

## 2024-05-28 NOTE — Assessment & Plan Note (Signed)
 His blood pressure is controlled.

## 2024-05-29 LAB — CMP14 + ANION GAP
ALT: 12 IU/L (ref 0–44)
AST: 18 IU/L (ref 0–40)
Albumin: 4.5 g/dL (ref 3.8–4.8)
Alkaline Phosphatase: 65 IU/L (ref 44–121)
Anion Gap: 15 mmol/L (ref 10.0–18.0)
BUN/Creatinine Ratio: 12 (ref 10–24)
BUN: 15 mg/dL (ref 8–27)
Bilirubin Total: 0.7 mg/dL (ref 0.0–1.2)
CO2: 24 mmol/L (ref 20–29)
Calcium: 9.4 mg/dL (ref 8.6–10.2)
Chloride: 105 mmol/L (ref 96–106)
Creatinine, Ser: 1.23 mg/dL (ref 0.76–1.27)
Globulin, Total: 2.5 g/dL (ref 1.5–4.5)
Glucose: 104 mg/dL — ABNORMAL HIGH (ref 70–99)
Potassium: 4.3 mmol/L (ref 3.5–5.2)
Sodium: 144 mmol/L (ref 134–144)
Total Protein: 7 g/dL (ref 6.0–8.5)
eGFR: 60 mL/min/1.73 (ref >59)

## 2024-05-29 LAB — LIPID PANEL
Chol/HDL Ratio: 2.5 ratio (ref 0.0–5.0)
Cholesterol, Total: 139 mg/dL (ref 100–199)
HDL: 56 mg/dL (ref >39)
LDL Chol Calc (NIH): 50 mg/dL (ref 0–99)
Triglycerides: 206 mg/dL — ABNORMAL HIGH (ref 0–149)
VLDL Cholesterol Cal: 33 mg/dL (ref 5–40)

## 2024-05-29 LAB — HEMOGLOBIN A1C
Est. average glucose Bld gHb Est-mCnc: 105 mg/dL
Hgb A1c MFr Bld: 5.3 % (ref 4.8–5.6)

## 2024-05-29 LAB — TSH: TSH: 1.08 u[IU]/mL (ref 0.450–4.500)

## 2024-05-29 LAB — VITAMIN B12: Vitamin B-12: 476 pg/mL (ref 232–1245)

## 2024-05-31 ENCOUNTER — Ambulatory Visit: Payer: Self-pay

## 2024-06-14 ENCOUNTER — Other Ambulatory Visit: Payer: Self-pay | Admitting: Internal Medicine

## 2024-06-28 ENCOUNTER — Ambulatory Visit: Admitting: Internal Medicine

## 2024-06-28 ENCOUNTER — Encounter: Admitting: Internal Medicine

## 2024-06-28 ENCOUNTER — Other Ambulatory Visit: Payer: Self-pay | Admitting: Internal Medicine

## 2024-06-28 ENCOUNTER — Encounter: Payer: Self-pay | Admitting: Internal Medicine

## 2024-06-28 VITALS — BP 128/78 | HR 58 | Temp 97.8°F | Resp 18 | Ht 71.0 in | Wt 209.0 lb

## 2024-06-28 DIAGNOSIS — I495 Sick sinus syndrome: Secondary | ICD-10-CM

## 2024-06-28 DIAGNOSIS — E782 Mixed hyperlipidemia: Secondary | ICD-10-CM

## 2024-06-28 DIAGNOSIS — Z1159 Encounter for screening for other viral diseases: Secondary | ICD-10-CM | POA: Insufficient documentation

## 2024-06-28 DIAGNOSIS — G629 Polyneuropathy, unspecified: Secondary | ICD-10-CM | POA: Diagnosis not present

## 2024-06-28 DIAGNOSIS — I4719 Other supraventricular tachycardia: Secondary | ICD-10-CM

## 2024-06-28 DIAGNOSIS — Z6829 Body mass index (BMI) 29.0-29.9, adult: Secondary | ICD-10-CM | POA: Diagnosis not present

## 2024-06-28 DIAGNOSIS — N1831 Chronic kidney disease, stage 3a: Secondary | ICD-10-CM | POA: Diagnosis not present

## 2024-06-28 DIAGNOSIS — Z0000 Encounter for general adult medical examination without abnormal findings: Secondary | ICD-10-CM | POA: Insufficient documentation

## 2024-06-28 DIAGNOSIS — I2510 Atherosclerotic heart disease of native coronary artery without angina pectoris: Secondary | ICD-10-CM | POA: Diagnosis not present

## 2024-06-28 DIAGNOSIS — I10 Essential (primary) hypertension: Secondary | ICD-10-CM

## 2024-06-28 DIAGNOSIS — J449 Chronic obstructive pulmonary disease, unspecified: Secondary | ICD-10-CM

## 2024-06-28 DIAGNOSIS — Z23 Encounter for immunization: Secondary | ICD-10-CM

## 2024-06-28 MED ORDER — PREVNAR 20 0.5 ML IM SUSY: 0.5000 mL | PREFILLED_SYRINGE | INTRAMUSCULAR | 0 refills | Status: AC

## 2024-06-28 MED ORDER — TETANUS-DIPHTHERIA TOXOIDS TD 5-2 LFU IM INJ: 0.5000 mL | INJECTION | Freq: Once | INTRAMUSCULAR | 0 refills | Status: AC

## 2024-06-28 MED ORDER — SHINGRIX 50 MCG/0.5ML IM SUSR: 0.5000 mL | Freq: Once | INTRAMUSCULAR | 0 refills | Status: AC

## 2024-06-28 NOTE — Progress Notes (Signed)
 Office Visit  Subjective   Patient ID: Luis Hood   DOB: 04/04/1946   Age: 78 y.o.   MRN: [id number]   Chief Complaint Chief Complaint  Patient presents with   Annual Exam     History of Present Illness 78 years old male is here for annual wellness examination. He live with his daughter. He smoke cigar once a day, he drink 2-3 shots every day. He take mariguana.   He get flu shot every year. He never has pneumonia vaccine, he has one shingle vaccine few year ago. He has COVID vaccine, He does not remember when was his last tetanus shot was.   He has colonoscopy in the past, he woke up four times at night.  He denies any cancer in his family.   He has no fall with in last 1 year, he score 25/30 on MMSE. He has 1 point on PHQ-2 test.   He has pacemaker for sick sinus and , he also has CAD and see cardiologist once a year. He says he get SOB when he walk one block. He also has history of PAT and he takes amiodarone  5 days a week.   He has COPD and he take breztri  2 puff twice a day.   He has hypertension, he check at home and his reading been ok. His blood pressure is target controlled. He takes amlodipine  He also has CKD 3a and he takes farxiga  10 mg daily.   He has hyperlipidemia and he takes rosuvastatin  20 mg daily.   He has GERD and he takes omeprazole  20 mg daily.   He also has low back pain for many years, he says pain does not wake up from sleep.   He also is c/o numbness and tingling feeling in his hand and feet and he take lyrica  50 mg twice a day, he says that he can not tell the difference.     Past Medical History Past Medical History:  Diagnosis Date   Chest pain 10/20/2019   Corn of toe 05/13/2016   COVID-19 10/06/2019   Dyslipidemia 10/20/2019   Dyspnea on exertion 10/20/2019   Essential hypertension 12/20/2014   Hammer toes of both feet 05/13/2016   Tinea pedis of both feet 05/13/2016     Allergies No Known Allergies   Review of Systems Review of  Systems  Constitutional: Negative.   HENT: Negative.    Respiratory:  Positive for shortness of breath.   Cardiovascular: Negative.   Gastrointestinal: Negative.   Neurological: Negative.        Objective:    Vitals BP 128/78   Pulse (!) 58   Temp 97.8 F (36.6 C)   Resp 18   Ht 5' 11 (1.803 m)   Wt 209 lb (94.8 kg)   SpO2 97%   BMI 29.15 kg/m    Physical Examination Physical Exam Constitutional:      Appearance: Normal appearance.  Eyes:     Extraocular Movements: Extraocular movements intact.     Pupils: Pupils are equal, round, and reactive to light.  Cardiovascular:     Rate and Rhythm: Normal rate and regular rhythm.     Heart sounds: Normal heart sounds.  Pulmonary:     Effort: Pulmonary effort is normal.     Breath sounds: Normal breath sounds.  Abdominal:     General: Bowel sounds are normal.     Palpations: Abdomen is soft.  Neurological:     General: No focal deficit present.  Mental Status: He is alert and oriented to person, place, and time.        Assessment & Plan:   Sinus node dysfunction (HCC) S/p pacemaker,   PAT (paroxysmal atrial tachycardia) (HCC) He is on amiodarone  200 mg five days a week and he will follow with cardiologist  Essential hypertension His blood pressure is good on amlodipine  5 mg daily.  Coronary artery disease involving native coronary artery of native heart without angina pectoris stable  Chronic obstructive pulmonary disease (HCC) He will continue with Breztri  and because of SOB, I will schedule him for spirometry with and without inhalor  Peripheral polyneuropathy He is on pregabalin  and I will increase the dose to 75 mg twice a day  Chronic kidney disease, stage 3a (HCC) Stable and he takes farxiga  10 mg daily.  Well adult exam I will schedule him for vaccination, he will get flu shot here. I will do hepatitis C screening.  Mixed hyperlipidemia His LDL is target controlled in August.   Encounter  for hepatitis C screening test for low risk patient I will do hepatitis C antibody.     Return in about 3 months (around 09/27/2024).   Roetta Dare, MD

## 2024-06-28 NOTE — Assessment & Plan Note (Signed)
 Stable and he takes farxiga  10 mg daily.

## 2024-06-28 NOTE — Assessment & Plan Note (Signed)
 His blood pressure is good on amlodipine  5 mg daily.

## 2024-06-28 NOTE — Assessment & Plan Note (Signed)
 He is on amiodarone  200 mg five days a week and he will follow with cardiologist

## 2024-06-28 NOTE — Assessment & Plan Note (Signed)
 I will do hepatitis C antibody.

## 2024-06-28 NOTE — Assessment & Plan Note (Signed)
 His LDL is target controlled in August.

## 2024-06-28 NOTE — Assessment & Plan Note (Signed)
 He will continue with Breztri  and because of SOB, I will schedule him for spirometry with and without inhalor

## 2024-06-28 NOTE — Assessment & Plan Note (Signed)
 stable

## 2024-06-28 NOTE — Assessment & Plan Note (Signed)
 S/p pacemaker,

## 2024-06-28 NOTE — Addendum Note (Signed)
 Addended byBETHA HILDEGARD ERNST on: 06/28/2024 09:42 AM   Modules accepted: Orders

## 2024-06-28 NOTE — Assessment & Plan Note (Signed)
 He is on pregabalin  and I will increase the dose to 75 mg twice a day

## 2024-06-28 NOTE — Assessment & Plan Note (Signed)
 I will schedule him for vaccination, he will get flu shot here. I will do hepatitis C screening.

## 2024-06-29 ENCOUNTER — Ambulatory Visit: Payer: Self-pay

## 2024-06-29 ENCOUNTER — Other Ambulatory Visit: Payer: Self-pay

## 2024-06-29 LAB — HEPATITIS C ANTIBODY: Hep C Virus Ab: NONREACTIVE

## 2024-06-29 NOTE — Progress Notes (Signed)
 Patient called.  Patient aware. His labs ok

## 2024-07-01 ENCOUNTER — Other Ambulatory Visit: Payer: Self-pay | Admitting: Internal Medicine

## 2024-07-01 MED ORDER — TAMSULOSIN HCL 0.4 MG PO CAPS: 0.4000 mg | ORAL_CAPSULE | Freq: Every day | ORAL | 6 refills | Status: AC

## 2024-07-02 ENCOUNTER — Telehealth: Payer: Self-pay

## 2024-07-02 NOTE — Telephone Encounter (Signed)
 Pt called in stating that he thinks his device is moving in his chest, pt denies any pain just concern

## 2024-07-02 NOTE — Telephone Encounter (Signed)
 Patient feels like device moves from left to right in his chest and vibrates.  Denies any pain. Only other symptom is some SOB with exertion.  He does not do remote transmissions and is past due for annual check up with Dr. Waddell.   Dr. Waddell has openings next Friday (1 week), patient refused sooner appointment for eval. Says this has been going on for 1 month and next Friday is best for him as he has to arrange transportation.   Appt made with Dr. Waddell on Friday 07/09/24 at 2pm.  He was given address to new building and our device clinic phone number should symptoms progress. If severe symptoms he was told to go to the ER.   Patient verbalizes understanding.

## 2024-07-06 ENCOUNTER — Ambulatory Visit

## 2024-07-09 ENCOUNTER — Encounter: Payer: Self-pay | Admitting: Internal Medicine

## 2024-07-09 ENCOUNTER — Ambulatory Visit: Attending: Internal Medicine | Admitting: Internal Medicine

## 2024-07-09 VITALS — BP 106/58 | HR 80 | Ht 71.0 in | Wt 207.8 lb

## 2024-07-09 DIAGNOSIS — Z950 Presence of cardiac pacemaker: Secondary | ICD-10-CM | POA: Diagnosis not present

## 2024-07-09 DIAGNOSIS — I10 Essential (primary) hypertension: Secondary | ICD-10-CM

## 2024-07-09 MED ORDER — FUROSEMIDE 20 MG PO TABS: 20.0000 mg | ORAL_TABLET | Freq: Every day | ORAL | 3 refills | Status: AC

## 2024-07-09 NOTE — Progress Notes (Signed)
 HPI Luis Hood returns today for followup of his AT, PVC's and LV dysfunction. He is a pleasant 78 yo man with HTN who has been treated for PVC's with amiodarone . He initially had some cough and his ACE inhibitor was stopped. His cough resolved. He also has sinus node dysfunction and underwent PPM insertion over 2 years ago. 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 Mild peripheral edema on norvasc . He denies chest pain or sob.  No Known Allergies   Current Outpatient Medications  Medication Sig Dispense Refill   acetaminophen  (TYLENOL ) 500 MG tablet Take 1,000 mg by mouth every 6 (six) hours as needed for moderate pain or headache.     amiodarone  (PACERONE ) 200 MG tablet TAKE ONE TABLET BY MOUTH EVERY DAY EXCEPT SATURDAY AND SUNDAY 90 tablet 2   amLODipine  (NORVASC ) 10 MG tablet TAKE ONE TABLET BY MOUTH EVERY DAY. NEEDAPPOINTMENT FOR REFILLS 90 tablet 1   budesonide-glycopyrrolate-formoterol (BREZTRI  AEROSPHERE) 160-9-4.8 MCG/ACT AERO inhaler Inhale 2 puffs into the lungs in the morning and at bedtime. 2 each 1   FARXIGA  10 MG TABS tablet TAKE ONE TABLET BY MOUTH DAILY 90 tablet 1   FLOVENT  HFA 110 MCG/ACT inhaler INHALE 2 PUFFS BY MOUTH AS NEEDED 12 g 3   lidocaine  (LIDODERM ) 5 % Place 1 patch onto the skin daily. Remove & Discard patch within 12 hours or as directed by MD 15 patch 0   Multiple Vitamin (MULTIVITAMIN WITH MINERALS) TABS tablet Take 1 tablet by mouth daily.     Naphazoline-Glycerin (REDNESS RELIEF OP) Place 1 drop into both eyes daily as needed (redness/ irritation).     nitroGLYCERIN  (NITROSTAT ) 0.4 MG SL tablet Place 0.4 mg under the tongue every 5 (five) minutes as needed for chest pain.     Omega-3 Fatty Acids (FISH OIL) 1000 MG CAPS Take 1,000 mg by mouth daily.     omeprazole  (PRILOSEC) 20 MG capsule TAKE 2 CAPSULES BY MOUTH DAILY 90 capsule 1   pregabalin  (LYRICA ) 50 MG capsule Take 1 capsule  (50 mg total) by mouth 2 (two) times daily. 60 capsule 5   rosuvastatin  (CRESTOR ) 20 MG tablet Take 1 tablet (20 mg total) by mouth daily. 90 tablet 2   tamsulosin  (FLOMAX ) 0.4 MG CAPS capsule Take 1 capsule (0.4 mg total) by mouth daily after supper. 30 capsule 6   VENTOLIN  HFA 108 (90 Base) MCG/ACT inhaler INHALE 1 PUFF BY MOUTH EVERY 6 HOURS AS NEEDED FOR WHEEZING OR SHORTNESS OF BREATH 18 g 0   No current facility-administered medications for this visit.     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   Past Surgical History:  Procedure Laterality Date   CATARACT EXTRACTION Bilateral    HERNIA REPAIR     LEFT HEART CATH AND CORONARY ANGIOGRAPHY N/A 10/28/2019   Procedure: LEFT HEART CATH AND CORONARY ANGIOGRAPHY;  Surgeon: Anner Alm ORN, MD;  Location: [facility] INVASIVE CV LAB;  Service: Cardiovascular;  Laterality: N/A;   PACEMAKER IMPLANT N/A 11/27/2020   Procedure: PACEMAKER IMPLANT;  Surgeon: Waddell Danelle ORN, MD;  Location: [facility] INVASIVE CV LAB;  Service: Cardiovascular;  Laterality: N/A;   REPLACEMENT TOTAL KNEE Bilateral    TONSILLECTOMY       Family History  Problem Relation Age of Onset   Seizures Mother      Social History   Socioeconomic History   Marital status: Divorced    Spouse name: Not on file   Number of children: Not on file   Years of education: Not on file   Highest education level: Not on file  Occupational History   Not on file  Tobacco Use   Smoking status: Every Day    Types: Cigars   Smokeless tobacco: Never  Vaping Use   Vaping status: Never Used  Substance and Sexual Activity   Alcohol use: Yes    Alcohol/week: 3.0 - 4.0 standard drinks  of alcohol    Types: 3 - 4 Shots of liquor per week    Comment: Daily   Drug use: Yes    Types: Marijuana    Comment: every day   Sexual activity: Not on file  Other Topics Concern   Not on file  Social History Narrative   ** Merged History Encounter **       Social Drivers of Corporate investment banker Strain: Not on file  Food Insecurity: Low Risk  (01/08/2024)   Received from [facility]   Hunger Vital Sign    Within the past 12 months, you worried that your food would run out before you got money to buy more: Never true    Within the past 12 months, the food you bought just didn't last and you didn't have money to get more. : Never true  Transportation Needs: No Transportation Needs (01/08/2024)   Received from [facility]    In the past 12 months, has lack of reliable transportation kept you from medical appointments, meetings, work or from getting things needed for daily living? : No  Physical Activity: Not on file  Stress: Not on file  Social Connections: Not on file  Intimate Partner Violence: Not on file     There were no vitals taken for this visit.  Physical Exam:  Well appearing NAD HEENT: Unremarkable Neck:  No JVD, no thyromegally Lymphatics:  No adenopathy Back:  No CVA tenderness Lungs:  Clear HEART:  Regular rate rhythm, no murmurs, no rubs, no clicks Abd:  soft, positive bowel sounds, no organomegally, no rebound, no guarding Ext:  2 plus pulses, no edema, no cyanosis, no clubbing Skin:  No rashes no nodules Neuro:  CN II through XII intact, motor grossly intact  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  See PaceArt for details.   Assess/Plan:   Sinus node dysfunction - he has undergone PPM insertion and is doing well. His underlying HR today is 40/min. He feels well with more energy and is able to work and do strenuous physical activity. 2. PVC/AT - he appears to be maintaining NSR nicely on low  dose amiodarone . Continue. I will consider weaning his dose down when I see him next. 3. HTN  - his bp is well controlled. We will follow. 4. Cough - we were initially concerned that this might be due to amio but it has resolved with cessation of his ACE.  5. SOB - this is new. I have asked him to take lasix 20 mg daily and come back  for labs in 2-3 weeks.  Danelle Harjot Dibello,MD

## 2024-07-09 NOTE — Addendum Note (Signed)
 Addended by: Jaunita Mikels N on: 07/09/2024 02:31 PM   Modules accepted: Orders

## 2024-07-09 NOTE — Patient Instructions (Addendum)
 Medication Instructions:  Your physician has recommended you make the following change in your medication:  Start Lasix 20 mg daily  Lab Work: BMP in 2/3 weeks  You may go to any [facility] Location for your lab work:  [facility] - [address] ([facility]) - [address] [phone] [address]  [place] - [address]   [place]  - [address]   [place] - [address] - [address] [employer]  - [address] ([facility]   If you have labs (blood work) drawn today and your tests are completely normal, you will receive your results only by: [employer] (if you have MyChart)  If you have any lab test that is abnormal or we need to change your treatment, we will call you or send a MyChart message to review the results.  Testing/Procedures: None ordered.  Follow-Up: At [facility], you and your health needs are our priority.  As part of our continuing mission to provide you with exceptional heart care, we have created designated Provider Care Teams.  These Care Teams include your primary Cardiologist (physician) and Advanced Practice Providers (APPs -  Physician Assistants and Nurse Practitioners) who all work together to provide you with the care you need, when you need it.  Your next appointment:   1 year(s)  The format for your next appointment:   In Person  Provider:   Donnice Primus, MD or one of the following Advanced Practice Providers on your designated Care Team:   Charlies Arthur, [place] Ozell Jodie Passey, [place] Leotis Barrack, NP  Note: Remote monitoring is used to monitor your Pacemaker/ ICD from home. This monitoring reduces the number of office visits required to check your device to one time per year. It allows us  to keep an eye on the functioning of your device to ensure it is working properly.

## 2024-07-13 ENCOUNTER — Ambulatory Visit

## 2024-07-23 ENCOUNTER — Other Ambulatory Visit: Payer: Self-pay

## 2024-07-23 DIAGNOSIS — Z950 Presence of cardiac pacemaker: Secondary | ICD-10-CM

## 2024-07-23 DIAGNOSIS — I10 Essential (primary) hypertension: Secondary | ICD-10-CM

## 2024-07-23 LAB — BASIC METABOLIC PANEL WITH GFR
BUN/Creatinine Ratio: 14 (ref 10–24)
BUN: 20 mg/dL (ref 8–27)
CO2: 22 mmol/L (ref 20–29)
Calcium: 9.3 mg/dL (ref 8.6–10.2)
Chloride: 107 mmol/L — ABNORMAL HIGH (ref 96–106)
Creatinine, Ser: 1.47 mg/dL — ABNORMAL HIGH (ref 0.76–1.27)
Glucose: 121 mg/dL — ABNORMAL HIGH (ref 70–99)
Potassium: 3.9 mmol/L (ref 3.5–5.2)
Sodium: 143 mmol/L (ref 134–144)
eGFR: 49 mL/min/1.73 — ABNORMAL LOW (ref >59)

## 2024-07-30 ENCOUNTER — Other Ambulatory Visit: Payer: Self-pay

## 2024-07-30 MED ORDER — AMLODIPINE BESYLATE 10 MG PO TABS: 10.0000 mg | ORAL_TABLET | Freq: Every day | ORAL | 1 refills | Status: AC

## 2024-08-02 ENCOUNTER — Other Ambulatory Visit: Payer: Self-pay | Admitting: Internal Medicine

## 2024-08-09 ENCOUNTER — Ambulatory Visit: Admitting: Internal Medicine

## 2024-08-20 ENCOUNTER — Other Ambulatory Visit: Payer: Self-pay

## 2024-08-20 MED ORDER — DAPAGLIFLOZIN PROPANEDIOL 10 MG PO TABS
10.0000 mg | ORAL_TABLET | Freq: Every day | ORAL | 1 refills | Status: AC
Start: 2024-08-20 — End: ?

## 2024-08-20 NOTE — Progress Notes (Signed)
 Refilled Farxiga  10mg  to [facility] in [place]

## 2024-09-20 ENCOUNTER — Other Ambulatory Visit: Payer: Self-pay | Admitting: Internal Medicine

## 2024-09-24 ENCOUNTER — Ambulatory Visit: Admitting: Internal Medicine

## 2024-09-24 ENCOUNTER — Encounter: Payer: Self-pay | Admitting: Internal Medicine

## 2024-09-24 VITALS — BP 110/70 | HR 65 | Temp 97.6°F | Resp 18 | Ht 71.0 in | Wt 208.4 lb

## 2024-09-24 DIAGNOSIS — I2510 Atherosclerotic heart disease of native coronary artery without angina pectoris: Secondary | ICD-10-CM | POA: Diagnosis not present

## 2024-09-24 DIAGNOSIS — G629 Polyneuropathy, unspecified: Secondary | ICD-10-CM

## 2024-09-24 DIAGNOSIS — I10 Essential (primary) hypertension: Secondary | ICD-10-CM

## 2024-09-24 DIAGNOSIS — J449 Chronic obstructive pulmonary disease, unspecified: Secondary | ICD-10-CM

## 2024-09-24 DIAGNOSIS — E782 Mixed hyperlipidemia: Secondary | ICD-10-CM | POA: Diagnosis not present

## 2024-09-24 MED ORDER — PREGABALIN 75 MG PO CAPS: 75.0000 mg | ORAL_CAPSULE | Freq: Two times a day (BID) | ORAL | 2 refills | Status: AC

## 2024-09-24 NOTE — Assessment & Plan Note (Signed)
"   His B12 level was normal and I will increase the dose of pregabalin  to 75 twice a day. "

## 2024-09-24 NOTE — Assessment & Plan Note (Signed)
 He denies any chest pain

## 2024-09-24 NOTE — Assessment & Plan Note (Signed)
 He takes rosuvastatin 20 mg daily. I will repeat labs on next visit.

## 2024-09-24 NOTE — Progress Notes (Signed)
 "  Office Visit  Subjective   Patient ID: Luis Hood   DOB: 11/03/1945   Age: 78 y.o.   MRN: [id number]   Chief Complaint Chief Complaint  Patient presents with   Follow-up    3 month     History of Present Illness 78 years old male with history of hypertension, coronary artery disease, COPD is here for follow up. He was seen by cardiologist who has stopped ACI because of dry cough. His cough is better. He was started on Lasix  20 mg daily.  He also has a pacemaker checked by cardiologist.  He admits that he gets short of breath in walking half a block.  He also feels like that he has no energy and does not want to do anything.  His labs were drawn 2 months ago by his cardiologist and his kidney function was slightly worse with GFR of 49.  I will repeat that today.    He says that he has tingling feeling is little better with pregabalin  50 mg daily.  He still has some feeling in his toes.  Will increase the dose of pre globulin 275 mg twice a day.  He did not felt any difference with gabapentin  so that was stopped by me.     He has hypertension and he take amlodipine  10 mg daily and his blood pressure is controlled.     He also has hyperlipidemia and he take rosuvastatin  20 mg daily.  I will do lipid panel and CMP today.     He also has a coronary artery disease and he says that it is time for him to have cardiology appointment.     He has COPD and he does not take any inhaler.  He does admit that he gets short of breath.  He just take rescue inhaler albuterol .  Past Medical History Past Medical History:  Diagnosis Date   Chest pain 10/20/2019   Corn of toe 05/13/2016   COVID-19 10/06/2019   Dyslipidemia 10/20/2019   Dyspnea on exertion 10/20/2019   Essential hypertension 12/20/2014   Hammer toes of both feet 05/13/2016   Tinea pedis of both feet 05/13/2016     Allergies Allergies[1]   Review of Systems Review of Systems  Constitutional: Negative.   HENT: Negative.     Respiratory:  Positive for shortness of breath.   Cardiovascular: Negative.   Gastrointestinal: Negative.   Neurological: Negative.        Objective:    Vitals BP 110/70   Pulse 65   Temp 97.6 F (36.4 C)   Resp 18   Ht 5' 11 (1.803 m)   Wt 208 lb 6 oz (94.5 kg)   SpO2 94%   BMI 29.06 kg/m    Physical Examination Physical Exam Constitutional:      Appearance: Normal appearance.  HENT:     Head: Normocephalic and atraumatic.  Cardiovascular:     Rate and Rhythm: Normal rate and regular rhythm.     Heart sounds: Normal heart sounds.  Pulmonary:     Effort: Pulmonary effort is normal.     Breath sounds: Normal breath sounds.  Abdominal:     General: Bowel sounds are normal.     Palpations: Abdomen is soft.  Neurological:     General: No focal deficit present.     Mental Status: He is alert and oriented to person, place, and time.        Assessment & Plan:   Essential hypertension  His blood pressure is controlled.  Coronary artery disease involving native coronary artery of native heart without angina pectoris   He denies any chest pain.  Chronic obstructive pulmonary disease (HCC)   He has COPD and he take breztri   2 puff twice a day  Peripheral polyneuropathy  His B12 level was normal and I will increase the dose of pregabalin  to 75 twice a day.  Mixed hyperlipidemia   He takes rosuvastatin  20 mg daily I will repeat labs on next visit.    Return in about 3 months (around 12/23/2024) for for PE on next visit.   Roetta Dare, MD      [1] No Known Allergies  "

## 2024-09-24 NOTE — Assessment & Plan Note (Signed)
 His blood pressure is controlled.

## 2024-09-24 NOTE — Assessment & Plan Note (Signed)
"    He has COPD and he take breztri   2 puff twice a day "

## 2024-09-25 LAB — CMP14 + ANION GAP
ALT: 12 IU/L (ref 0–44)
AST: 18 IU/L (ref 0–40)
Albumin: 4.5 g/dL (ref 3.8–4.8)
Alkaline Phosphatase: 71 IU/L (ref 47–123)
Anion Gap: 14 mmol/L (ref 10.0–18.0)
BUN/Creatinine Ratio: 11 (ref 10–24)
BUN: 18 mg/dL (ref 8–27)
Bilirubin Total: 0.6 mg/dL (ref 0.0–1.2)
CO2: 23 mmol/L (ref 20–29)
Calcium: 9.3 mg/dL (ref 8.6–10.2)
Chloride: 104 mmol/L (ref 96–106)
Creatinine, Ser: 1.58 mg/dL — ABNORMAL HIGH (ref 0.76–1.27)
Globulin, Total: 2.7 g/dL (ref 1.5–4.5)
Glucose: 94 mg/dL (ref 70–99)
Potassium: 4.1 mmol/L (ref 3.5–5.2)
Sodium: 141 mmol/L (ref 134–144)
Total Protein: 7.2 g/dL (ref 6.0–8.5)
eGFR: 44 mL/min/1.73 — ABNORMAL LOW

## 2024-09-28 ENCOUNTER — Ambulatory Visit: Payer: Self-pay

## 2024-09-28 NOTE — Progress Notes (Signed)
 Patient called.  Patient aware. His labs are stable

## 2024-10-06 ENCOUNTER — Other Ambulatory Visit: Payer: Self-pay | Admitting: Internal Medicine

## 2024-10-21 ENCOUNTER — Other Ambulatory Visit: Payer: Self-pay | Admitting: Internal Medicine

## 2024-12-22 ENCOUNTER — Ambulatory Visit: Admitting: Internal Medicine
